# Patient Record
Sex: Female | Born: 1970 | Race: Asian | Hispanic: No | Marital: Single
Health system: Southern US, Community
[De-identification: ages and names within clinical notes are randomized; demographics above are authoritative.]

## PROBLEM LIST (undated history)

## (undated) ENCOUNTER — Inpatient Hospital Stay (HOSPITAL_COMMUNITY): Payer: Self-pay

## (undated) DIAGNOSIS — O24419 Gestational diabetes mellitus in pregnancy, unspecified control: Secondary | ICD-10-CM

## (undated) HISTORY — PX: WISDOM TOOTH EXTRACTION: SHX21

---

## 2008-04-21 ENCOUNTER — Ambulatory Visit: Payer: Self-pay | Admitting: Family Medicine

## 2008-04-22 ENCOUNTER — Telehealth (INDEPENDENT_AMBULATORY_CARE_PROVIDER_SITE_OTHER): Payer: Self-pay | Admitting: Nurse Practitioner

## 2010-03-17 ENCOUNTER — Inpatient Hospital Stay (HOSPITAL_COMMUNITY): Admission: AD | Admit: 2010-03-17 | Discharge: 2010-03-17 | Payer: Self-pay | Admitting: Obstetrics & Gynecology

## 2010-03-17 ENCOUNTER — Ambulatory Visit: Payer: Self-pay | Admitting: Family

## 2010-03-21 ENCOUNTER — Ambulatory Visit: Payer: Self-pay | Admitting: Nurse Practitioner

## 2010-03-21 ENCOUNTER — Inpatient Hospital Stay (HOSPITAL_COMMUNITY): Admission: AD | Admit: 2010-03-21 | Discharge: 2010-03-21 | Payer: Self-pay | Admitting: Obstetrics & Gynecology

## 2010-03-23 ENCOUNTER — Ambulatory Visit: Payer: Self-pay | Admitting: Obstetrics and Gynecology

## 2010-03-23 ENCOUNTER — Inpatient Hospital Stay (HOSPITAL_COMMUNITY): Admission: AD | Admit: 2010-03-23 | Discharge: 2010-03-23 | Payer: Self-pay | Admitting: Obstetrics & Gynecology

## 2010-03-30 ENCOUNTER — Ambulatory Visit: Payer: Self-pay | Admitting: Obstetrics and Gynecology

## 2010-03-31 ENCOUNTER — Encounter: Payer: Self-pay | Admitting: Obstetrics and Gynecology

## 2010-03-31 LAB — CONVERTED CEMR LAB: hCG, Beta Chain, Quant, S: 38.8 milliintl units/mL

## 2010-10-05 LAB — CBC
MCH: 32.6 pg (ref 26.0–34.0)
RDW: 12.9 % (ref 11.5–15.5)

## 2010-10-05 LAB — HCG, QUANTITATIVE, PREGNANCY: hCG, Beta Chain, Quant, S: 1435 m[IU]/mL — ABNORMAL HIGH (ref <5)

## 2010-10-06 LAB — URINE MICROSCOPIC-ADD ON

## 2010-10-06 LAB — WET PREP, GENITAL
Clue Cells Wet Prep HPF POC: NONE SEEN
Trich, Wet Prep: NONE SEEN

## 2010-10-06 LAB — URINALYSIS, ROUTINE W REFLEX MICROSCOPIC
Bilirubin Urine: NEGATIVE
Glucose, UA: NEGATIVE mg/dL
Ketones, ur: NEGATIVE mg/dL
Protein, ur: NEGATIVE mg/dL

## 2010-10-06 LAB — CBC
Hemoglobin: 13.2 g/dL (ref 12.0–15.0)
MCV: 94.3 fL (ref 78.0–100.0)
RBC: 4.13 MIL/uL (ref 3.87–5.11)
RDW: 13.1 % (ref 11.5–15.5)

## 2010-10-06 LAB — ABO/RH: ABO/RH(D): AB POS

## 2010-10-06 LAB — POCT PREGNANCY, URINE: Preg Test, Ur: POSITIVE

## 2011-11-13 ENCOUNTER — Inpatient Hospital Stay (HOSPITAL_COMMUNITY)
Admission: AD | Admit: 2011-11-13 | Discharge: 2011-11-13 | Disposition: A | Discharge status: Home / Self Care | Payer: Self-pay | Source: Ambulatory Visit | Attending: Obstetrics & Gynecology | Admitting: Obstetrics & Gynecology

## 2011-11-13 ENCOUNTER — Encounter (HOSPITAL_COMMUNITY): Payer: Self-pay

## 2011-11-13 DIAGNOSIS — R059 Cough, unspecified: Secondary | ICD-10-CM | POA: Insufficient documentation

## 2011-11-13 DIAGNOSIS — J069 Acute upper respiratory infection, unspecified: Secondary | ICD-10-CM | POA: Insufficient documentation

## 2011-11-13 DIAGNOSIS — R05 Cough: Secondary | ICD-10-CM | POA: Insufficient documentation

## 2011-11-13 DIAGNOSIS — O99891 Other specified diseases and conditions complicating pregnancy: Secondary | ICD-10-CM | POA: Insufficient documentation

## 2011-11-13 DIAGNOSIS — J3489 Other specified disorders of nose and nasal sinuses: Secondary | ICD-10-CM | POA: Insufficient documentation

## 2011-11-13 LAB — URINALYSIS, ROUTINE W REFLEX MICROSCOPIC
Bilirubin Urine: NEGATIVE
Glucose, UA: 100 mg/dL — AB
Ketones, ur: NEGATIVE mg/dL
Leukocytes, UA: NEGATIVE
Protein, ur: NEGATIVE mg/dL
Specific Gravity, Urine: <1.005 — ABNORMAL LOW (ref 1.005–1.030)
pH: 6.5 (ref 5.0–8.0)

## 2011-11-13 LAB — URINE MICROSCOPIC-ADD ON

## 2011-11-13 LAB — POCT PREGNANCY, URINE: Preg Test, Ur: POSITIVE — AB

## 2011-11-13 MED ORDER — DM-GUAIFENESIN ER 30-600 MG PO TB12: 1.0000 | ORAL_TABLET | Freq: Two times a day (BID) | ORAL | Status: AC

## 2011-11-13 MED ORDER — PRENATAL PLUS 27-1 MG PO TABS: 1.0000 | ORAL_TABLET | Freq: Every day | ORAL | Status: DC

## 2011-11-13 MED ORDER — DM-GUAIFENESIN ER 30-600 MG PO TB12
1.0000 | ORAL_TABLET | Freq: Once | ORAL | Status: AC
  Administered 2011-11-13: 1 via ORAL
  Filled 2011-11-13: qty 1

## 2011-11-13 NOTE — MAU Provider Note (Signed)
  History     CSN: [id number]  Arrival date and time: 11/13/11 1421   First Provider Initiated Contact with Patient 11/13/11 1450      Chief Complaint  Patient presents with  . Cough  . Nasal Congestion   Patient is a 41 y.o. female presenting with cough. The history is provided by the patient and the spouse.  Cough This is a new problem. The cough is non-productive. There has been no fever. Associated symptoms include rhinorrhea and eye redness. Pertinent negatives include no chest pain, no chills, no shortness of breath and no wheezing.  pt is [redacted]w[redacted]d pregnant and unsure of intended care provider- awaiting Medicaid- from [place].  Pt complains of dry hacking cough, mostly at night 2- 2:30am with occ white sputum.  She also has nasal congestion with some sore throat and noticed some enlargement of glands in neck, but have subsided.  She has some reddness on her right eye sclera.   Past Medical History  Diagnosis Date  . No pertinent past medical history     No past surgical history on file.  No family history on file.  History  Substance Use Topics  . Smoking status: Never Smoker   . Smokeless tobacco: Not on file  . Alcohol Use: No    Allergies: Allergies not on file  No prescriptions prior to admission    Review of Systems  Constitutional: Negative for chills.  HENT: Positive for rhinorrhea.   Eyes: Positive for redness.  Respiratory: Positive for cough. Negative for shortness of breath and wheezing.   Cardiovascular: Negative for chest pain.   Physical Exam   Blood pressure 99/56, pulse 76, temperature 97.6 F (36.4 C), temperature source Oral, resp. rate 16, height 5\' 1"  (1.549 m), weight 113 lb 6.4 oz (51.438 kg), last menstrual period 10/01/2011, SpO2 98.00%.  Physical Exam  Nursing note and vitals reviewed. Constitutional: She is oriented to person, place, and time. She appears well-developed and well-nourished.  HENT:  Head: Normocephalic.  Eyes: Pupils  are equal, round, and reactive to light. Right eye exhibits no discharge.  Neck: Normal range of motion. Neck supple. No thyromegaly present.  Cardiovascular: Normal rate and normal heart sounds.   Respiratory: Effort normal and breath sounds normal.  Lymphadenopathy:    She has no cervical adenopathy.  Neurological: She is alert and oriented to person, place, and time.  Skin: Skin is warm and dry.  Psychiatric: She has a normal mood and affect.    MAU Course  Procedures URI  Assessment and Plan  URI in pregnancy  Brandy Suarez 11/13/2011, 3:01 PM

## 2011-11-13 NOTE — MAU Note (Signed)
Cough and congestion x 1 week 

## 2011-11-13 NOTE — MAU Note (Signed)
Patient states that her stomach is sore from coughing denies pain.

## 2012-01-07 ENCOUNTER — Inpatient Hospital Stay (HOSPITAL_COMMUNITY)
Admission: AD | Admit: 2012-01-07 | Discharge: 2012-01-07 | Disposition: A | Discharge status: Home / Self Care | Payer: Medicaid Other | Source: Ambulatory Visit | Attending: Obstetrics & Gynecology | Admitting: Obstetrics & Gynecology

## 2012-01-07 ENCOUNTER — Encounter (HOSPITAL_COMMUNITY): Payer: Self-pay | Admitting: *Deleted

## 2012-01-07 DIAGNOSIS — O99891 Other specified diseases and conditions complicating pregnancy: Secondary | ICD-10-CM | POA: Insufficient documentation

## 2012-01-07 DIAGNOSIS — Z3490 Encounter for supervision of normal pregnancy, unspecified, unspecified trimester: Secondary | ICD-10-CM

## 2012-01-07 DIAGNOSIS — R0682 Tachypnea, not elsewhere classified: Secondary | ICD-10-CM | POA: Insufficient documentation

## 2012-01-07 DIAGNOSIS — R509 Fever, unspecified: Secondary | ICD-10-CM | POA: Insufficient documentation

## 2012-01-07 DIAGNOSIS — R0602 Shortness of breath: Secondary | ICD-10-CM | POA: Insufficient documentation

## 2012-01-07 DIAGNOSIS — R079 Chest pain, unspecified: Secondary | ICD-10-CM | POA: Insufficient documentation

## 2012-01-07 DIAGNOSIS — R069 Unspecified abnormalities of breathing: Secondary | ICD-10-CM

## 2012-01-07 LAB — URINE MICROSCOPIC-ADD ON

## 2012-01-07 LAB — URINALYSIS, ROUTINE W REFLEX MICROSCOPIC
Bilirubin Urine: NEGATIVE
Glucose, UA: NEGATIVE mg/dL
Ketones, ur: NEGATIVE mg/dL
Protein, ur: NEGATIVE mg/dL
Specific Gravity, Urine: 1.015 (ref 1.005–1.030)
Urobilinogen, UA: 0.2 mg/dL (ref 0.0–1.0)
pH: 7 (ref 5.0–8.0)

## 2012-01-07 LAB — CBC: WBC: 13.8 10*3/uL — ABNORMAL HIGH (ref 4.0–10.5)

## 2012-01-07 MED ORDER — PRENATAL VITAMINS (DIS) PO TABS: 1.0000 | ORAL_TABLET | Freq: Every day | ORAL | Status: DC

## 2012-01-07 NOTE — MAU Note (Signed)
Patient states she has had nausea for a few days, has had cold like symptoms for a couple of weeks with a sore throat and sometimes feels like she can't catch her breath. Denies any bleeding or pain.

## 2012-01-07 NOTE — Discharge Instructions (Signed)
Prenatal Care Providers °[facility]    [facility] ° Phone- [phone]     Phone: [phone] °         °[facility]                      [facility] ° @[place]     Phone: [phone] ° Phone: [phone] °        [facility] °[facility]     Phone: [phone] ° Phone: [phone]   °        [facility] °[facility] [place]                hone: [phone] ° Phone: [phone] °        [facility] °Dr. Bernard Marshall      Phone: [phone] ° Phone: [phone] °        [facility] °[facility]                Phone: [phone] ° [facility]  ° Phone:[phone]    [facility] °         Phone: [phone] °[facility] °  Phone: [phone] °

## 2012-01-07 NOTE — MAU Provider Note (Signed)
History     CSN: [id number]  Arrival date and time: 01/07/12 1414   None     Chief Complaint  Patient presents with  . Nausea  . Shortness of Breath   HPI  Subjective fever:  Feels as though her skin feels hot, but has not taking temperature.  Also has chills every day.  Is better this week.  Shortness of breath:  Has been going on for last month.  Feels short of breath whether she is resting or moving. Can go up and down a flight of stairs and walk over 2 blocks.  Denies chest pain.  Would like ultrasound to see how baby is doing:  She is concerned because she had a miscarriage after her last pregnancy and would like reassurance that this baby is doing okay.  Tired all the time:  Denies lightheadedness, loss of consciousness, dizziness.  OB History    Grav Para Term Preterm Abortions TAB SAB Ect Mult Living   3 1   1  1   1       Past Medical History  Diagnosis Date  . No pertinent past medical history     History reviewed. No pertinent past surgical history.  History reviewed. No pertinent family history.  History  Substance Use Topics  . Smoking status: Never Smoker   . Smokeless tobacco: Never Used  . Alcohol Use: No    Allergies: No Known Allergies  No prescriptions prior to admission    ROS  See HPI.  All other relevant ROS negative.  Physical Exam   Blood pressure 99/60, pulse 78, temperature 98.8 F (37.1 C), temperature source Oral, resp. rate 18, height 4\' 11"  (1.499 m), weight 51.347 kg (113 lb 3.2 oz), last menstrual period 10/01/2011, SpO2 100.00%.  Physical Exam  Constitutional: She is oriented to person, place, and time. She appears well-developed and well-nourished. No distress.  HENT:  Head: Normocephalic and atraumatic.  Eyes: Conjunctivae and EOM are normal.  Neck: No tracheal deviation present.  Cardiovascular: Normal rate, regular rhythm and normal heart sounds.   Respiratory: Effort normal and breath sounds normal. No stridor.    GI: Soft. She exhibits no distension and no mass. There is no tenderness. There is no rebound and no guarding.  Neurological: She is alert and oriented to person, place, and time.  Skin: She is not diaphoretic.  Psychiatric: She has a normal mood and affect. Her behavior is normal. Judgment and thought content normal.    MAU Course  Procedures  MDM   Assessment and Plan  Subjective fever: encouraged her to buy a thermometer and take her temperature.  If her temperature is greater than 100.4, she should call her physician or come back to the MAU.  Her mild leukocytosis is normal for pregnancy.  Shortness of breath without tachypnea, hypoxia, or chest pain.  Likely dyspnea secondary to physiologic changes in pregnancy.  Fatigue: Likely secondary to physiologic changes in pregnancy.  No anemia on CBC.  Results for orders placed during the hospital encounter of 01/07/12 (from the past 24 hour(s))  URINALYSIS, ROUTINE W REFLEX MICROSCOPIC     Status: Abnormal   Collection Time   01/07/12  3:00 PM      Component Value Range   Color, Urine YELLOW  YELLOW   APPearance CLEAR  CLEAR   Specific Gravity, Urine 1.015  1.005 - 1.030   pH 7.0  5.0 - 8.0   Glucose, UA NEGATIVE  NEGATIVE mg/dL   Hgb urine  dipstick TRACE (*) NEGATIVE   Bilirubin Urine NEGATIVE  NEGATIVE   Ketones, ur NEGATIVE  NEGATIVE mg/dL   Protein, ur NEGATIVE  NEGATIVE mg/dL   Urobilinogen, UA 0.2  0.0 - 1.0 mg/dL   Nitrite NEGATIVE  NEGATIVE   Leukocytes, UA NEGATIVE  NEGATIVE  URINE MICROSCOPIC-ADD ON     Status: Abnormal   Collection Time   01/07/12  3:00 PM      Component Value Range   Squamous Epithelial / LPF FEW (*) RARE   WBC, UA 0-2  <3 WBC/hpf   RBC / HPF 0-2  <3 RBC/hpf   Bacteria, UA FEW (*) RARE  CBC     Status: Abnormal   Collection Time   01/07/12  5:08 PM      Component Value Range   WBC 13.8 (*) 4.0 - 10.5 K/uL   RBC 3.89  3.87 - 5.11 MIL/uL   Hemoglobin 12.1  12.0 - 15.0 g/dL   HCT 16.1 (*) 09.6  - 46.0 %   MCV 91.0  78.0 - 100.0 fL   MCH 31.1  26.0 - 34.0 pg   MCHC 34.2  30.0 - 36.0 g/dL   RDW 04.5  40.9 - 81.1 %   Platelets 269  150 - 400 K/uL    Yaniv Lage 01/07/2012, 4:52 PM   I have been involved in the care of this patient. I discussed with the patient need for prenatal care and discussed reason for CBC today. I have reviewed this patient's vital signs, nurses notes and appropriate labs.

## 2012-01-31 ENCOUNTER — Inpatient Hospital Stay (HOSPITAL_COMMUNITY)
Admission: AD | Admit: 2012-01-31 | Discharge: 2012-01-31 | Disposition: A | Discharge status: Home / Self Care | Payer: Medicaid Other | Source: Ambulatory Visit | Attending: Obstetrics & Gynecology | Admitting: Obstetrics & Gynecology

## 2012-01-31 ENCOUNTER — Encounter (HOSPITAL_COMMUNITY): Payer: Self-pay | Admitting: *Deleted

## 2012-01-31 ENCOUNTER — Inpatient Hospital Stay (HOSPITAL_COMMUNITY): Payer: Medicaid Other

## 2012-01-31 DIAGNOSIS — O4690 Antepartum hemorrhage, unspecified, unspecified trimester: Secondary | ICD-10-CM

## 2012-01-31 DIAGNOSIS — O209 Hemorrhage in early pregnancy, unspecified: Secondary | ICD-10-CM | POA: Insufficient documentation

## 2012-01-31 LAB — WET PREP, GENITAL
Clue Cells Wet Prep HPF POC: NONE SEEN
Trich, Wet Prep: NONE SEEN
Yeast Wet Prep HPF POC: NONE SEEN

## 2012-01-31 NOTE — MAU Note (Signed)
Patient states she had a little bleeding last week, no pain.

## 2012-01-31 NOTE — MAU Note (Signed)
Pt heard fetal heart today. No pain or bleeding.  Timing of [place] explained.

## 2012-01-31 NOTE — MAU Note (Signed)
Here for [place] today. Requests and [place], last wk she was bleeding.

## 2012-02-01 LAB — GC/CHLAMYDIA PROBE AMP, GENITAL: GC Probe Amp, Genital: NEGATIVE

## 2012-03-12 ENCOUNTER — Encounter (HOSPITAL_COMMUNITY): Payer: Self-pay | Admitting: *Deleted

## 2012-03-12 ENCOUNTER — Inpatient Hospital Stay (HOSPITAL_COMMUNITY)
Admission: AD | Admit: 2012-03-12 | Discharge: 2012-03-12 | Disposition: A | Discharge status: Home / Self Care | Payer: Medicaid Other | Source: Ambulatory Visit | Attending: Obstetrics & Gynecology | Admitting: Obstetrics & Gynecology

## 2012-03-12 DIAGNOSIS — R42 Dizziness and giddiness: Secondary | ICD-10-CM | POA: Insufficient documentation

## 2012-03-12 DIAGNOSIS — O99891 Other specified diseases and conditions complicating pregnancy: Secondary | ICD-10-CM | POA: Insufficient documentation

## 2012-03-12 DIAGNOSIS — O26899 Other specified pregnancy related conditions, unspecified trimester: Secondary | ICD-10-CM

## 2012-03-12 DIAGNOSIS — O0930 Supervision of pregnancy with insufficient antenatal care, unspecified trimester: Secondary | ICD-10-CM | POA: Insufficient documentation

## 2012-03-12 DIAGNOSIS — R51 Headache: Secondary | ICD-10-CM

## 2012-03-12 LAB — CBC WITH DIFFERENTIAL/PLATELET
Eosinophils Relative: 2 % (ref 0–5)
HCT: 32 % — ABNORMAL LOW (ref 36.0–46.0)
Hemoglobin: 10.6 g/dL — ABNORMAL LOW (ref 12.0–15.0)
Lymphocytes Relative: 15 % (ref 12–46)
Lymphs Abs: 2 10*3/uL (ref 0.7–4.0)
MCV: 93.6 fL (ref 78.0–100.0)
Monocytes Absolute: 0.9 10*3/uL (ref 0.1–1.0)
Monocytes Relative: 7 % (ref 3–12)
Neutro Abs: 9.8 10*3/uL — ABNORMAL HIGH (ref 1.7–7.7)
RDW: 13.6 % (ref 11.5–15.5)
WBC: 12.8 10*3/uL — ABNORMAL HIGH (ref 4.0–10.5)

## 2012-03-12 LAB — URINALYSIS, ROUTINE W REFLEX MICROSCOPIC
Glucose, UA: NEGATIVE mg/dL
Hgb urine dipstick: NEGATIVE
Ketones, ur: NEGATIVE mg/dL
Protein, ur: NEGATIVE mg/dL
Urobilinogen, UA: 0.2 mg/dL (ref 0.0–1.0)

## 2012-03-12 LAB — URINE MICROSCOPIC-ADD ON

## 2012-03-12 MED ORDER — ACETAMINOPHEN 325 MG PO TABS
650.0000 mg | ORAL_TABLET | Freq: Once | ORAL | Status: AC
  Administered 2012-03-12: 650 mg via ORAL
  Filled 2012-03-12: qty 2

## 2012-03-12 NOTE — Progress Notes (Signed)
Pt complaining of dizziness and but also has a small headache

## 2012-03-12 NOTE — MAU Note (Signed)
Pt started having pain about 3 days ago, Pt has felt dizzy for about  1 week. Some nausea no vomiting

## 2012-03-12 NOTE — MAU Provider Note (Signed)
History     CSN: [id number]  Arrival date and time: 03/12/12 1451   First Provider Initiated Contact with Patient 03/12/12 1534      Chief Complaint  Patient presents with  . Pelvic Pain  . Dizziness   HPI  She is a 41 year old G3P1011 at [redacted]w[redacted]d with no prenatal care who presents today with new onset of dizziness and a headache that started a couple days ago.  No "room-spinning" sensation or presyncope.  The headache is mostly temporal, bilateral and intermittent.  Better when she rests.  Hasnt tried any meds for the headache.  No history of migraines.  Says she has been mildly nauseated recently and is worse with the headache.  No vomiting.  No fever or chills.  Fluid intake is fine.  Feels fetal movements.  No dysuria.  No vaginal bleeding or discharge and no leaking of fluids.  No contractions.  [place] and her husband translates appropriately for her.  Says they have sought prenatal care but haven't been able to get into a clinic yet.   Past Medical History  Diagnosis Date  . No pertinent past medical history     History reviewed. No pertinent past surgical history.  Family History  Problem Relation Age of Onset  . Other Neg Hx     History  Substance Use Topics  . Smoking status: Never Smoker   . Smokeless tobacco: Never Used  . Alcohol Use: No    Allergies: No Known Allergies  Prescriptions prior to admission  Medication Sig Dispense Refill  . Prenatal Vit-Fe Fumarate-FA (PRENATAL MULTIVITAMIN) TABS Take 1 tablet by mouth daily.        Review of Systems  Constitutional: Negative for fever and chills.  Eyes: Positive for blurred vision. Negative for double vision and photophobia.  Cardiovascular: Negative for chest pain and palpitations.  Gastrointestinal: Positive for nausea. Negative for vomiting, abdominal pain and diarrhea.  Genitourinary: Negative for dysuria and urgency.  Neurological: Positive for dizziness and headaches (bilateral, temporal).  Negative for speech change, focal weakness and loss of consciousness.   Physical Exam   Blood pressure 98/60, pulse 85, temperature 97.3 F (36.3 C), temperature source Oral, resp. rate 18, height 5' (1.524 m), weight 53.343 kg (117 lb 9.6 oz), last menstrual period 10/01/2011.  Physical Exam  Constitutional: She is oriented to person, place, and time. She appears well-developed and well-nourished. No distress.  HENT:  Head: Normocephalic.  Eyes: Pupils are equal, round, and reactive to light.  Cardiovascular: Normal rate, regular rhythm and normal heart sounds.   Respiratory: Effort normal and breath sounds normal.  GI: Soft. She exhibits no distension. There is no tenderness.  Neurological: She is alert and oriented to person, place, and time.  Skin: Skin is warm and dry.   Results for orders placed during the hospital encounter of 03/12/12 (from the past 24 hour(s))  URINALYSIS, ROUTINE W REFLEX MICROSCOPIC     Status: Abnormal   Collection Time   03/12/12  3:00 PM      Component Value Range   Color, Urine YELLOW  YELLOW   APPearance CLEAR  CLEAR   Specific Gravity, Urine 1.010  1.005 - 1.030   pH 7.5  5.0 - 8.0   Glucose, UA NEGATIVE  NEGATIVE mg/dL   Hgb urine dipstick NEGATIVE  NEGATIVE   Bilirubin Urine NEGATIVE  NEGATIVE   Ketones, ur NEGATIVE  NEGATIVE mg/dL   Protein, ur NEGATIVE  NEGATIVE mg/dL   Urobilinogen,  UA 0.2  0.0 - 1.0 mg/dL   Nitrite NEGATIVE  NEGATIVE   Leukocytes, UA SMALL (*) NEGATIVE  URINE MICROSCOPIC-ADD ON     Status: Abnormal   Collection Time   03/12/12  3:00 PM      Component Value Range   Squamous Epithelial / LPF FEW (*) RARE   WBC, UA 3-6  <3 WBC/hpf   RBC / HPF 0-2  <3 RBC/hpf   Bacteria, UA FEW (*) RARE  CBC WITH DIFFERENTIAL     Status: Abnormal   Collection Time   03/12/12  5:08 PM      Component Value Range   WBC 12.8 (*) 4.0 - 10.5 K/uL   RBC 3.42 (*) 3.87 - 5.11 MIL/uL   Hemoglobin 10.6 (*) 12.0 - 15.0 g/dL   HCT 16.1 (*) 09.6 -  46.0 %   MCV 93.6  78.0 - 100.0 fL   MCH 31.0  26.0 - 34.0 pg   MCHC 33.1  30.0 - 36.0 g/dL   RDW 04.5  40.9 - 81.1 %   Platelets 251  150 - 400 K/uL   Neutrophils Relative 76  43 - 77 %   Neutro Abs 9.8 (*) 1.7 - 7.7 K/uL   Lymphocytes Relative 15  12 - 46 %   Lymphs Abs 2.0  0.7 - 4.0 K/uL   Monocytes Relative 7  3 - 12 %   Monocytes Absolute 0.9  0.1 - 1.0 K/uL   Eosinophils Relative 2  0 - 5 %   Eosinophils Absolute 0.3  0.0 - 0.7 K/uL   Basophils Relative 0  0 - 1 %   Basophils Absolute 0.0  0.0 - 0.1 K/uL   MAU Course  Procedures Urinalysis CBC Fetal monitor tracing-reassuring Tylenol 650mg  PO for headache  17:15  I reviewed the history of present illness/chief complaint with the student and agree with his findings.  I will perform the pelvic exam.  Labs pending 17:40  Discussed with the patient and her husband the lab results and that she may take Tylenol prn for headache.  Her headache has greatly improved. Now 2/10.  They asked if we were planning an ultrasound to see the sex.  Explained that was not a medically necessary reason for Ultrasound.  They also need verification letter for Medicaid.  To begin prenatal care as soon as possible.  Phone number for [facility] given. Assessment and Plan    HOYLE, JASON 03/12/2012, 4:18 PM   A:  Headache at 103w2d gestation improved with Tylenol  P:  Verification letter given     Encouraged to begin prenatal care with doctor of their choice.

## 2012-04-02 ENCOUNTER — Other Ambulatory Visit (HOSPITAL_COMMUNITY)
Admission: RE | Admit: 2012-04-02 | Discharge: 2012-04-02 | Disposition: A | Discharge status: Home / Self Care | Payer: Medicaid Other | Source: Ambulatory Visit | Attending: Obstetrics and Gynecology | Admitting: Obstetrics and Gynecology

## 2012-04-02 ENCOUNTER — Ambulatory Visit (INDEPENDENT_AMBULATORY_CARE_PROVIDER_SITE_OTHER): Payer: Medicaid Other | Admitting: Obstetrics and Gynecology

## 2012-04-02 ENCOUNTER — Encounter: Payer: Self-pay | Admitting: Obstetrics and Gynecology

## 2012-04-02 VITALS — BP 97/56 | Wt 119.0 lb

## 2012-04-02 DIAGNOSIS — Z113 Encounter for screening for infections with a predominantly sexual mode of transmission: Secondary | ICD-10-CM | POA: Insufficient documentation

## 2012-04-02 DIAGNOSIS — O0930 Supervision of pregnancy with insufficient antenatal care, unspecified trimester: Secondary | ICD-10-CM | POA: Insufficient documentation

## 2012-04-02 DIAGNOSIS — O09522 Supervision of elderly multigravida, second trimester: Secondary | ICD-10-CM

## 2012-04-02 DIAGNOSIS — IMO0002 Reserved for concepts with insufficient information to code with codable children: Secondary | ICD-10-CM

## 2012-04-02 DIAGNOSIS — Z01419 Encounter for gynecological examination (general) (routine) without abnormal findings: Secondary | ICD-10-CM | POA: Insufficient documentation

## 2012-04-02 DIAGNOSIS — O09529 Supervision of elderly multigravida, unspecified trimester: Secondary | ICD-10-CM

## 2012-04-02 MED ORDER — PROMETHAZINE HCL 25 MG PO TABS: ORAL_TABLET | ORAL | Status: DC

## 2012-04-02 NOTE — Addendum Note (Signed)
Addended by: Catalina Antigua on: 04/02/2012 10:52 AM   Modules accepted: Orders

## 2012-04-02 NOTE — Patient Instructions (Signed)
Pregnancy - Second Trimester The second trimester of pregnancy (3 to 6 months) is a period of rapid growth for you and your baby. At the end of the sixth month, your baby is about 9 inches long and weighs 1 1/2 pounds. You will begin to feel the baby move between 18 and 20 weeks of the pregnancy. This is called quickening. Weight gain is faster. A clear fluid (colostrum) may leak out of your breasts. You may feel small contractions of the womb (uterus). This is known as false labor or Braxton-Hicks contractions. This is like a practice for labor when the baby is ready to be born. Usually, the problems with morning sickness have usually passed by the end of your first trimester. Some women develop small dark blotches (called cholasma, mask of pregnancy) on their face that usually goes away after the baby is born. Exposure to the sun makes the blotches worse. Acne may also develop in some pregnant women and pregnant women who have acne, may find that it goes away. PRENATAL EXAMS  Blood work may continue to be done during prenatal exams. These tests are done to check on your health and the probable health of your baby. Blood work is used to follow your blood levels (hemoglobin). Anemia (low hemoglobin) is common during pregnancy. Iron and vitamins are given to help prevent this. You will also be checked for diabetes between 24 and 28 weeks of the pregnancy. Some of the previous blood tests may be repeated.   The size of the uterus is measured during each visit. This is to make sure that the baby is continuing to grow properly according to the dates of the pregnancy.   Your blood pressure is checked every prenatal visit. This is to make sure you are not getting toxemia.   Your urine is checked to make sure you do not have an infection, diabetes or protein in the urine.   Your weight is checked often to make sure gains are happening at the suggested rate. This is to ensure that both you and your baby are  growing normally.   Sometimes, an ultrasound is performed to confirm the proper growth and development of the baby. This is a test which bounces harmless sound waves off the baby so your caregiver can more accurately determine due dates.  Sometimes, a specialized test is done on the amniotic fluid surrounding the baby. This test is called an amniocentesis. The amniotic fluid is obtained by sticking a needle into the belly (abdomen). This is done to check the chromosomes in instances where there is a concern about possible genetic problems with the baby. It is also sometimes done near the end of pregnancy if an early delivery is required. In this case, it is done to help make sure the baby's lungs are mature enough for the baby to live outside of the womb. CHANGES OCCURING IN THE SECOND TRIMESTER OF PREGNANCY Your body goes through many changes during pregnancy. They vary from person to person. Talk to your caregiver about changes you notice that you are concerned about.  During the second trimester, you will likely have an increase in your appetite. It is normal to have cravings for certain foods. This varies from person to person and pregnancy to pregnancy.   Your lower abdomen will begin to bulge.   You may have to urinate more often because the uterus and baby are pressing on your bladder. It is also common to get more bladder infections during pregnancy (  pain with urination). You can help this by drinking lots of fluids and emptying your bladder before and after intercourse.   You may begin to get stretch marks on your hips, abdomen, and breasts. These are normal changes in the body during pregnancy. There are no exercises or medications to take that prevent this change.   You may begin to develop swollen and bulging veins (varicose veins) in your legs. Wearing support hose, elevating your feet for 15 minutes, 3 to 4 times a day and limiting salt in your diet helps lessen the problem.    Heartburn may develop as the uterus grows and pushes up against the stomach. Antacids recommended by your caregiver helps with this problem. Also, eating smaller meals 4 to 5 times a day helps.   Constipation can be treated with a stool softener or adding bulk to your diet. Drinking lots of fluids, vegetables, fruits, and whole grains are helpful.   Exercising is also helpful. If you have been very active up until your pregnancy, most of these activities can be continued during your pregnancy. If you have been less active, it is helpful to start an exercise program such as walking.   Hemorrhoids (varicose veins in the rectum) may develop at the end of the second trimester. Warm sitz baths and hemorrhoid cream recommended by your caregiver helps hemorrhoid problems.   Backaches may develop during this time of your pregnancy. Avoid heavy lifting, wear low heal shoes and practice good posture to help with backache problems.   Some pregnant women develop tingling and numbness of their hand and fingers because of swelling and tightening of ligaments in the wrist (carpel tunnel syndrome). This goes away after the baby is born.   As your breasts enlarge, you may have to get a bigger bra. Get a comfortable, cotton, support bra. Do not get a nursing bra until the last month of the pregnancy if you will be nursing the baby.   You may get a dark line from your belly button to the pubic area called the linea nigra.   You may develop rosy cheeks because of increase blood flow to the face.   You may develop spider looking lines of the face, neck, arms and chest. These go away after the baby is born.  HOME CARE INSTRUCTIONS   It is extremely important to avoid all smoking, herbs, alcohol, and unprescribed drugs during your pregnancy. These chemicals affect the formation and growth of the baby. Avoid these chemicals throughout the pregnancy to ensure the delivery of a healthy infant.   Most of your home  care instructions are the same as suggested for the first trimester of your pregnancy. Keep your caregiver's appointments. Follow your caregiver's instructions regarding medication use, exercise and diet.   During pregnancy, you are providing food for you and your baby. Continue to eat regular, well-balanced meals. Choose foods such as meat, fish, milk and other low fat dairy products, vegetables, fruits, and whole-grain breads and cereals. Your caregiver will tell you of the ideal weight gain.   A physical sexual relationship may be continued up until near the end of pregnancy if there are no other problems. Problems could include early (premature) leaking of amniotic fluid from the membranes, vaginal bleeding, abdominal pain, or other medical or pregnancy problems.   Exercise regularly if there are no restrictions. Check with your caregiver if you are unsure of the safety of some of your exercises. The greatest weight gain will occur in the   last 2 trimesters of pregnancy. Exercise will help you:   Control your weight.   Get you in shape for labor and delivery.   Lose weight after you have the baby.   Wear a good support or jogging bra for breast tenderness during pregnancy. This may help if worn during sleep. Pads or tissues may be used in the bra if you are leaking colostrum.   Do not use hot tubs, steam rooms or saunas throughout the pregnancy.   Wear your seat belt at all times when driving. This protects you and your baby if you are in an accident.   Avoid raw meat, uncooked cheese, cat litter boxes and soil used by cats. These carry germs that can cause birth defects in the baby.   The second trimester is also a good time to visit your dentist for your dental health if this has not been done yet. Getting your teeth cleaned is OK. Use a soft toothbrush. Brush gently during pregnancy.   It is easier to loose urine during pregnancy. Tightening up and strengthening the pelvic muscles will  help with this problem. Practice stopping your urination while you are going to the bathroom. These are the same muscles you need to strengthen. It is also the muscles you would use as if you were trying to stop from passing gas. You can practice tightening these muscles up 10 times a set and repeating this about 3 times per day. Once you know what muscles to tighten up, do not perform these exercises during urination. It is more likely to contribute to an infection by backing up the urine.   Ask for help if you have financial, counseling or nutritional needs during pregnancy. Your caregiver will be able to offer counseling for these needs as well as refer you for other special needs.   Your skin may become oily. If so, wash your face with mild soap, use non-greasy moisturizer and oil or cream based makeup.  MEDICATIONS AND DRUG USE IN PREGNANCY  Take prenatal vitamins as directed. The vitamin should contain 1 milligram of folic acid. Keep all vitamins out of reach of children. Only a couple vitamins or tablets containing iron may be fatal to a baby or young child when ingested.   Avoid use of all medications, including herbs, over-the-counter medications, not prescribed or suggested by your caregiver. Only take over-the-counter or prescription medicines for pain, discomfort, or fever as directed by your caregiver. Do not use aspirin.   Let your caregiver also know about herbs you may be using.   Alcohol is related to a number of birth defects. This includes fetal alcohol syndrome. All alcohol, in any form, should be avoided completely. Smoking will cause low birth rate and premature babies.   Street or illegal drugs are very harmful to the baby. They are absolutely forbidden. A baby born to an addicted mother will be addicted at birth. The baby will go through the same withdrawal an adult does.  SEEK MEDICAL CARE IF:  You have any concerns or worries during your pregnancy. It is better to call with  your questions if you feel they cannot wait, rather than worry about them. SEEK IMMEDIATE MEDICAL CARE IF:   An unexplained oral temperature above 100.4 F (38 C) develops, or as your caregiver suggests.   You have leaking of fluid from the vagina (birth canal). If leaking membranes are suspected, take your temperature and tell your caregiver of this when you call.   There   is vaginal spotting, bleeding, or passing clots. Tell your caregiver of the amount and how many pads are used. Light spotting in pregnancy is common, especially following intercourse.   You develop a bad smelling vaginal discharge with a change in the color from clear to white.   You continue to feel sick to your stomach (nauseated) and have no relief from remedies suggested. You vomit blood or coffee ground-like materials.   You lose more than 2 pounds of weight or gain more than 2 pounds of weight over 1 week, or as suggested by your caregiver.   You notice swelling of your face, hands, feet, or legs.   You get exposed to German measles and have never had them.   You are exposed to fifth disease or chickenpox.   You develop belly (abdominal) pain. Round ligament discomfort is a common non-cancerous (benign) cause of abdominal pain in pregnancy. Your caregiver still must evaluate you.   You develop a bad headache that does not go away.   You develop fever, diarrhea, pain with urination, or shortness of breath.   You develop visual problems, blurry, or double vision.   You fall or are in a car accident or any kind of trauma.   There is mental or physical violence at home.  Document Released: 07/03/2001 Document Revised: 06/28/2011 Document Reviewed: 01/05/2009 [employer] Patient Information 2012 [employer]. 

## 2012-04-02 NOTE — Progress Notes (Signed)
   Subjective:    Brandy Suarez is a [id number] [redacted]w[redacted]d being seen today for her first obstetrical visit.  Her obstetrical history is significant for advanced maternal age. Patient does intend to breast feed. Pregnancy history fully reviewed.  Patient reports nausea. Patient otherwise doing well without complaints.  Filed Vitals:   04/02/12 1000  BP: 97/56  Weight: 119 lb (53.978 kg)    HISTORY: OB History    Grav Para Term Preterm Abortions TAB SAB Ect Mult Living   3 1 1  1  1   1      # Outc Date GA Lbr Len/2nd Wgt Sex Del Anes PTL Lv   1 TRM 12/00    M SVD  No Yes   2 SAB            3 CUR              Past Medical History  Diagnosis Date  . No pertinent past medical history    Past Surgical History  Procedure Date  . Wisdom tooth extraction    Family History  Problem Relation Age of Onset  . Other Neg Hx      Exam    Uterus:     Pelvic Exam:    Perineum: Normal Perineum   Vulva: normal   Vagina:  normal mucosa, normal discharge   pH:    Cervix: closed, long, posterior   Adnexa: not evaluated   Bony Pelvis: android  System: Breast:  normal appearance, no masses or tenderness   Skin: normal coloration and turgor, no rashes    Neurologic: oriented, grossly non-focal   Extremities: normal strength, tone, and muscle mass, no erythema, induration, or nodules   HEENT extra ocular movement intact   Mouth/Teeth mucous membranes moist, pharynx normal without lesions   Neck supple and no masses   Cardiovascular: regular rate and rhythm   Respiratory:  chest clear, no wheezing, crepitations, rhonchi, normal symmetric air entry   Abdomen: soft, gravid, NT   Urinary:       Assessment:    Pregnancy: G3P1011 Patient Active Problem List  Diagnosis  . INFLUENZA  . Supervision of elderly multigravida in second trimester  . Advanced maternal age in pregnancy  . Late prenatal care complicating pregnancy        Plan:     Initial labs drawn. Prenatal  vitamins. Problem list reviewed and updated. Patient referred to genetic counseling for advanced maternal age Genetic Screening discussed [place]: requested.  Ultrasound discussed; fetal survey: ordered.  Follow up in 3 weeks. Patient will return prior to next appointment for 1 hr GCT 30 min visit spent on counseling and coordination of care.     Maricarmen Braziel 04/02/2012

## 2012-04-03 LAB — OBSTETRIC PANEL
Antibody Screen: NEGATIVE
Basophils Absolute: 0 10*3/uL (ref 0.0–0.1)
HCT: 33.6 % — ABNORMAL LOW (ref 36.0–46.0)
Hemoglobin: 11.1 g/dL — ABNORMAL LOW (ref 12.0–15.0)
Lymphocytes Relative: 16 % (ref 12–46)
Lymphs Abs: 1.6 10*3/uL (ref 0.7–4.0)
MCV: 93.6 fL (ref 78.0–100.0)
Monocytes Absolute: 0.3 10*3/uL (ref 0.1–1.0)
Neutro Abs: 8 10*3/uL — ABNORMAL HIGH (ref 1.7–7.7)
RBC: 3.59 MIL/uL — ABNORMAL LOW (ref 3.87–5.11)
RDW: 14.1 % (ref 11.5–15.5)
Rubella: 8 IU/mL — ABNORMAL HIGH
WBC: 10 10*3/uL (ref 4.0–10.5)

## 2012-04-04 ENCOUNTER — Ambulatory Visit (HOSPITAL_COMMUNITY)
Admission: RE | Admit: 2012-04-04 | Discharge: 2012-04-04 | Disposition: A | Discharge status: Home / Self Care | Payer: Medicaid Other | Source: Ambulatory Visit | Attending: Obstetrics and Gynecology | Admitting: Obstetrics and Gynecology

## 2012-04-04 DIAGNOSIS — Z1389 Encounter for screening for other disorder: Secondary | ICD-10-CM | POA: Insufficient documentation

## 2012-04-04 DIAGNOSIS — O358XX0 Maternal care for other (suspected) fetal abnormality and damage, not applicable or unspecified: Secondary | ICD-10-CM | POA: Insufficient documentation

## 2012-04-04 DIAGNOSIS — Z363 Encounter for antenatal screening for malformations: Secondary | ICD-10-CM | POA: Insufficient documentation

## 2012-04-04 DIAGNOSIS — O09522 Supervision of elderly multigravida, second trimester: Secondary | ICD-10-CM

## 2012-04-04 DIAGNOSIS — O09529 Supervision of elderly multigravida, unspecified trimester: Secondary | ICD-10-CM | POA: Insufficient documentation

## 2012-04-07 ENCOUNTER — Encounter: Payer: Self-pay | Admitting: Obstetrics and Gynecology

## 2012-04-09 ENCOUNTER — Other Ambulatory Visit (INDEPENDENT_AMBULATORY_CARE_PROVIDER_SITE_OTHER): Payer: Medicaid Other | Admitting: *Deleted

## 2012-04-09 DIAGNOSIS — IMO0002 Reserved for concepts with insufficient information to code with codable children: Secondary | ICD-10-CM

## 2012-04-09 DIAGNOSIS — Z3480 Encounter for supervision of other normal pregnancy, unspecified trimester: Secondary | ICD-10-CM

## 2012-04-09 DIAGNOSIS — O24419 Gestational diabetes mellitus in pregnancy, unspecified control: Secondary | ICD-10-CM

## 2012-04-09 DIAGNOSIS — O09522 Supervision of elderly multigravida, second trimester: Secondary | ICD-10-CM

## 2012-04-09 MED ORDER — ESTRADIOL 0.025 MG/24HR TD PTTW: 1.0000 | MEDICATED_PATCH | TRANSDERMAL | Status: DC

## 2012-04-09 NOTE — Progress Notes (Addendum)
Patient is here for one hour glucose testing only.

## 2012-04-09 NOTE — Addendum Note (Signed)
Addended by: Barbara Cower on: 04/09/2012 03:25 PM   Modules accepted: Orders

## 2012-04-10 ENCOUNTER — Encounter: Payer: Self-pay | Admitting: Family Medicine

## 2012-04-10 DIAGNOSIS — O24419 Gestational diabetes mellitus in pregnancy, unspecified control: Secondary | ICD-10-CM | POA: Insufficient documentation

## 2012-04-10 LAB — CBC WITH DIFFERENTIAL/PLATELET
Eosinophils Absolute: 0.2 10*3/uL (ref 0.0–0.7)
Eosinophils Relative: 2 % (ref 0–5)
Hemoglobin: 11.6 g/dL — ABNORMAL LOW (ref 12.0–15.0)
Lymphs Abs: 1.8 10*3/uL (ref 0.7–4.0)
MCH: 30.9 pg (ref 26.0–34.0)
MCHC: 33 g/dL (ref 30.0–36.0)
MCV: 93.6 fL (ref 78.0–100.0)
Monocytes Relative: 5 % (ref 3–12)
RBC: 3.76 MIL/uL — ABNORMAL LOW (ref 3.87–5.11)

## 2012-04-10 NOTE — Addendum Note (Signed)
Addended by: Barbara Cower on: 04/10/2012 12:00 PM   Modules accepted: Orders

## 2012-04-11 LAB — HARMONY PRENATAL TEST: PDF Image: 0

## 2012-04-14 ENCOUNTER — Encounter: Payer: Self-pay | Admitting: Obstetrics and Gynecology

## 2012-04-23 ENCOUNTER — Ambulatory Visit (INDEPENDENT_AMBULATORY_CARE_PROVIDER_SITE_OTHER): Payer: Medicaid Other | Admitting: Obstetrics & Gynecology

## 2012-04-23 VITALS — BP 96/55 | Wt 121.0 lb

## 2012-04-23 DIAGNOSIS — O09522 Supervision of elderly multigravida, second trimester: Secondary | ICD-10-CM

## 2012-04-23 DIAGNOSIS — O09529 Supervision of elderly multigravida, unspecified trimester: Secondary | ICD-10-CM

## 2012-04-23 DIAGNOSIS — J1189 Influenza due to unidentified influenza virus with other manifestations: Secondary | ICD-10-CM

## 2012-04-23 DIAGNOSIS — Z609 Problem related to social environment, unspecified: Secondary | ICD-10-CM

## 2012-04-23 DIAGNOSIS — O24419 Gestational diabetes mellitus in pregnancy, unspecified control: Secondary | ICD-10-CM

## 2012-04-23 DIAGNOSIS — O99810 Abnormal glucose complicating pregnancy: Secondary | ICD-10-CM

## 2012-04-23 DIAGNOSIS — Z789 Other specified health status: Secondary | ICD-10-CM | POA: Insufficient documentation

## 2012-04-23 DIAGNOSIS — IMO0002 Reserved for concepts with insufficient information to code with codable children: Secondary | ICD-10-CM

## 2012-04-23 DIAGNOSIS — Z758 Other problems related to medical facilities and other health care: Secondary | ICD-10-CM | POA: Insufficient documentation

## 2012-04-23 NOTE — Patient Instructions (Signed)
Gestational Diabetes Mellitus Gestational diabetes mellitus (GDM) is diabetes that occurs only during pregnancy. This happens when the body cannot properly handle the glucose (sugar) that increases in the blood after eating. During pregnancy, insulin resistance (reduced sensitivity to insulin) occurs because of the release of hormones from the placenta. Usually, the pancreas of pregnant women produces enough insulin to overcome the resistance that occurs. However, in gestational diabetes, the insulin is there but it does not work effectively. If the resistance is severe enough that the pancreas does not produce enough insulin, extra glucose builds up in the blood.  WHO IS AT RISK FOR DEVELOPING GESTATIONAL DIABETES?  Women with a history of diabetes in the family.  Women over age 14.  Women who are overweight.  Women in certain ethnic groups (Hispanic, African American, Native American, [place] and [place]er). WHAT CAN HAPPEN TO THE BABY? If the mother's blood glucose is too high while she is pregnant, the extra sugar will travel through the umbilical cord to the baby. Some of the problems the baby may have are:  Large Baby - If the baby receives too much sugar, the baby will gain more weight. This may cause the baby to be too large to be born normally (vaginally) and a Cesarean section (C-section) may be needed.  Low Blood Glucose (hypoglycemia)  The baby makes extra insulin, in response to the extra sugar its gets from its mother. When the baby is born and no longer needs this extra insulin, the baby's blood glucose level may drop.  Jaundice (yellow coloring of the skin and eyes)  This is fairly common in babies. It is caused from a build-up of the chemical called bilirubin. This is rarely serious, but is seen more often in babies whose mothers had gestational diabetes. RISKS TO THE MOTHER Women who have had gestational diabetes may be at higher risk for some problems,  including:  Preeclampsia or toxemia, which includes problems with high blood pressure. Blood pressure and protein levels in the urine must be checked frequently.  Infections.  Cesarean section (C-section) for delivery.  Developing Type 2 diabetes later in life. About 30-50% will develop diabetes later, especially if obese. DIAGNOSIS  The hormones that cause insulin resistance are highest at about 24-28 weeks of pregnancy. If symptoms are experienced, they are much like symptoms you would normally expect during pregnancy.  GDM is often diagnosed using a two part method: 1. After 24-28 weeks of pregnancy, the woman drinks a glucose solution and takes a blood test. If the glucose level is high, a second test will be given. If it is too high (190 or above), no second test is needed and the patient is diagnosed with GDM. 2. Oral Glucose Tolerance Test (OGTT) which is 3 hours long  After not eating overnight, the blood glucose is checked. The woman drinks a glucose solution, and hourly blood glucose tests are taken. If the woman has risk factors for GDM, the caregiver may test earlier than 24 weeks of pregnancy. TREATMENT  Treatment of GDM is directed at keeping the mother's blood glucose level normal, and may include:  Meal planning.  Taking insulin or other medicine to control your blood glucose level.  Exercise.  Keeping a daily record of the foods you eat.  Blood glucose monitoring and keeping a record of your blood glucose levels.  May monitor ketone levels in the urine, although this is no longer considered necessary in most pregnancies. HOME CARE INSTRUCTIONS  While you are pregnant:  Follow your caregiver's advice regarding your prenatal appointments, meal planning, exercise, medicines, vitamins, blood and other tests, and physical activities.  Keep a record of your meals, blood glucose tests, and the amount of insulin you are taking (if any). Show this to your caregiver at every  prenatal visit.  If you have GDM, you may have problems with hypoglycemia (low blood glucose). You may suspect this if you become suddenly dizzy, feel shaky, and/or weak. If you think this is happening and you have a glucose meter, try to test your blood glucose level. Follow your caregiver's advice for when and how to treat your low blood glucose. Generally, the 15:15 rule is followed: Treat by consuming 15 grams of carbohydrates, wait 15 minutes, and recheck blood glucose. Examples of 15 grams of carbohydrates are:  1 cup skim or low-fat milk.   cup juice.  3-4 glucose tablets.  5-6 hard candies.  1 small box raisins.   cup regular soda pop.  Practice good hygiene, to avoid infections.  Do not smoke. SEEK MEDICAL CARE IF:   You develop abnormal vaginal discharge, with or without itching.  You become weak and tired more than expected.  You seem to sweat a lot.  You have a sudden increase in weight, 5 pounds or more in one week.  You are losing weight, 3 pounds or more in a week.  Your blood glucose level is high, and you need instructions on what to do about it. SEEK IMMEDIATE MEDICAL CARE IF:   You develop a severe headache.  You faint or pass out.  You develop nausea and vomiting.  You become disoriented or confused.  You have a convulsion.  You develop vision problems.  You develop stomach pain.  You develop vaginal bleeding.  You develop uterine contractions.  You have leaking or a gush of fluid from the vagina. AFTER YOU HAVE THE BABY:  Go to all of your follow-up appointments, and have blood tests as advised by your caregiver.  Maintain a healthy lifestyle, to prevent diabetes in the future. This includes:  Following a healthy meal plan.  Controlling your weight.  Getting enough exercise and proper rest.  Do not smoke.  Breastfeed your baby if you can. This will lower the chance of you and your baby developing diabetes later in life. For  more information about diabetes, go to the American Diabetes Association at: PMFashions.com.cy. For more information about gestational diabetes, go to the [employer] at: RentRule.com.au. Document Released: 10/15/2000 Document Revised: 10/01/2011 Document Reviewed: 05/09/2009 [facility] Patient Information 2013 [place], [place].

## 2012-04-23 NOTE — Progress Notes (Signed)
New diagnosis of GDM. Using the [place] interpreter, explained the diagnosis of GDM and its maternal-fetal implications.  Patient will meet with DM educator and Nutritionist tomorrow and get supplies for CBG testing.  Counseled about GDM diet.  No other complaints or concerns.  Fetal movement and labor precautions reviewed. Return in one week.

## 2012-04-24 ENCOUNTER — Ambulatory Visit (HOSPITAL_COMMUNITY)
Admission: RE | Admit: 2012-04-24 | Discharge: 2012-04-24 | Disposition: A | Discharge status: Home / Self Care | Payer: Medicaid Other | Source: Ambulatory Visit | Attending: Obstetrics & Gynecology | Admitting: Obstetrics & Gynecology

## 2012-04-24 ENCOUNTER — Encounter: Payer: Medicaid Other | Attending: Obstetrics & Gynecology | Admitting: Dietician

## 2012-04-24 DIAGNOSIS — Z713 Dietary counseling and surveillance: Secondary | ICD-10-CM | POA: Insufficient documentation

## 2012-04-24 DIAGNOSIS — O99810 Abnormal glucose complicating pregnancy: Secondary | ICD-10-CM | POA: Insufficient documentation

## 2012-04-24 NOTE — ED Notes (Addendum)
Diabetes Education:  A G3 P1 lady comes for GDM education.  Accompanied by her husband and the [place] interpreter. HT: 49 in.  WT: 121 lb.  EDD: 07/07/2012. Completed a review of the diet and meal schedule for the GDM.  She currently has a schedule of eating 2 meals per day and sleeps until late in the morning.  Tried to get some more small snacks, meals into her routine.  We will need to see how this influences her blood glucose levels.  From assessment, she tends to follow a [place] diet.  Diet is high in noodles, rice, non-starchy vegetables and a little meat/protein at meals.  Provided an Accu-Chek Smartview meter kit.  She  Is to contact her MD for a prescription for the Accu-Chek Smart View Strips and the Accu-Chek FastClix lancets.  Today on return demonstration of BGM, her glucose was 96 at 4:30 PM. Instructed to monitor fasting and 2 hr after the first bite of each meal.  Instructed to bring meter and glucose log to all clinic appointments.  Maggie Charlese Gruetzmacher, RN, CDE   Provided educational materials in [place] "Nutrition, Diabetes, and Pregnancy".

## 2012-04-30 ENCOUNTER — Ambulatory Visit (INDEPENDENT_AMBULATORY_CARE_PROVIDER_SITE_OTHER): Payer: Medicaid Other | Admitting: Obstetrics & Gynecology

## 2012-04-30 VITALS — BP 89/64 | Wt 120.0 lb

## 2012-04-30 DIAGNOSIS — IMO0002 Reserved for concepts with insufficient information to code with codable children: Secondary | ICD-10-CM

## 2012-04-30 DIAGNOSIS — O24419 Gestational diabetes mellitus in pregnancy, unspecified control: Secondary | ICD-10-CM

## 2012-04-30 DIAGNOSIS — Z23 Encounter for immunization: Secondary | ICD-10-CM

## 2012-04-30 DIAGNOSIS — O09522 Supervision of elderly multigravida, second trimester: Secondary | ICD-10-CM

## 2012-04-30 DIAGNOSIS — Z789 Other specified health status: Secondary | ICD-10-CM

## 2012-04-30 DIAGNOSIS — O0930 Supervision of pregnancy with insufficient antenatal care, unspecified trimester: Secondary | ICD-10-CM

## 2012-04-30 DIAGNOSIS — Z609 Problem related to social environment, unspecified: Secondary | ICD-10-CM

## 2012-04-30 DIAGNOSIS — O99810 Abnormal glucose complicating pregnancy: Secondary | ICD-10-CM

## 2012-04-30 DIAGNOSIS — O09529 Supervision of elderly multigravida, unspecified trimester: Secondary | ICD-10-CM

## 2012-04-30 MED ORDER — ACCU-CHEK FASTCLIX LANCETS MISC: 1.0000 [IU] | Freq: Four times a day (QID) | Status: DC

## 2012-04-30 MED ORDER — GLUCOSE BLOOD VI STRP: ORAL_STRIP | Status: DC

## 2012-04-30 NOTE — Patient Instructions (Signed)
Return to clinic for any obstetric concerns or go to [facility] for evaluation  

## 2012-04-30 NOTE — Progress Notes (Signed)
Had her GDm education with Seward Grater, offered transferring to [facility] DM clinic since she lives in [place] but she wants to continue here. No CBGs recorded, prescribed lancets and strips.  Return in one week. Flu shot today.  No other complaints or concerns.  Fetal movement and labor precautions reviewed.  [place] interpreter present.

## 2012-05-19 ENCOUNTER — Ambulatory Visit (INDEPENDENT_AMBULATORY_CARE_PROVIDER_SITE_OTHER): Payer: Medicaid Other | Admitting: Family Medicine

## 2012-05-19 VITALS — BP 96/62 | Wt 123.0 lb

## 2012-05-19 DIAGNOSIS — Z3490 Encounter for supervision of normal pregnancy, unspecified, unspecified trimester: Secondary | ICD-10-CM

## 2012-05-19 DIAGNOSIS — Z3480 Encounter for supervision of other normal pregnancy, unspecified trimester: Secondary | ICD-10-CM

## 2012-05-19 DIAGNOSIS — O99810 Abnormal glucose complicating pregnancy: Secondary | ICD-10-CM

## 2012-05-19 DIAGNOSIS — O9989 Other specified diseases and conditions complicating pregnancy, childbirth and the puerperium: Secondary | ICD-10-CM

## 2012-05-19 DIAGNOSIS — Z283 Underimmunization status: Secondary | ICD-10-CM | POA: Insufficient documentation

## 2012-05-19 DIAGNOSIS — O24419 Gestational diabetes mellitus in pregnancy, unspecified control: Secondary | ICD-10-CM

## 2012-05-19 NOTE — Patient Instructions (Addendum)
Cho Con B (Breastfeeding) L?I CH C?A VI?C CHO CON B ??i v?i em b  S?a ??u (s?a non) gip h? tiu ha c?a b ho?t ??ng t?t h?n.   C nh?ng khng th? t? m? trong s?a gip b ch?ng l?i s? ly nhi?m.   B t b? ?nh h??ng b?i b?nh hen suy?n, d? ?ng v SIDS (sudden infant death syndrome, h?i ch?ng ??t t? ? tr? em).   Cc ch?t dinh d??ng trong s?a m? t?t h?n s?a cng th?c cho b v gip no b pht tri?n t?t h?n.   Nh?ng em b ???c b s?a m? c t kh, t ?au b?ng v t b? to bn h?n.  ??i v?i m?  Cho con b gip pht tri?n m?i lin k?t ??c bi?t gi?a m? v b.   Cho con b ti?n l?i h?n, s?a lun c s?n ? nhi?t ?? ph h?p v r? h?n cho ?n s?a cng th?c.   Cho con b s? ??t calo trong m? v gip gi?m cn ? t?ng trong qu trnh mang thai.   Cho con b s? lm cho c? t? cung co v? kch c? bnh th??ng nhanh h?n v lm ch?m s? ch?y mu sau khi sinh.   Nh?ng b m? cho con b t c nguy c? pht tri?n ung th? v h?n.  CHO B TH??NG XUYN  M?t em b kh?e m?nh, ?? thng c th? ???c cho b m? t? m?i gi? m?t l?n ??n ba gi? m?t l?n.   T?n su?t cho b khc nhau, ty thu?c vo t?ng b. Theo di b xem c d?u hi?u b? ?i hay khng, ch? khng ph?i theo di ??ng h?Theodoro Grist b th??ng xuyn khi em b ?i, ho?c khi b?n c?n gi?m ?? c?ng c?a b?u s?a.   ?nh th?c b n?u ? qu 3-4 gi? k? t? l?n cho b tr??c.   Cho b th??ng xuyn s? gip m? t?o nhi?u s?a h?n v s? trnh v?n ?? nh? l ?au nm v v ? mu ? ng?c.  V? TR C?A B SO V?I NG?C  Cho d n?m hay ng?i, hy ??m b?o b?ng c?a b quay v? pha b?ng b?n.   ?? ng?c b?ng b?n ngn tay ??t d??i ng?c v ngn ci ? pha trn. ??m b?o cc ngn tay c?a b?n ??t ? xa nm v v mi?ng b.   Nh? nhng vu?t mi v bn m c?a b st v?i ng?c nh?t b?ng ngn tay ho?c nm v.   Khi mi?ng b m? ?? r?ng, hy ??t ton b? nm v v cng nhi?u vng s?m mu quanh nm v vo mi?ng b cng t?t.   Ko b st vo ?? ??u m?i v m b ch?m vo ng?c trong khi b.  CHO  B  Th?i gian c?a m?i l?n b ty thu?c vo t?ng b v t?ng l?n b.   B ph?i mt kho?ng hai ??n ba pht ?? s?a ch?y vo mi?ng b. ?y ???c g?i l qu trnh "x?". V l do ny, hy ?? b b trn m?i ng?c cho t?i khi b khng cn mu?n b. B s? ng?ng b khi ? nh?n ?? dinh d??ng.   ?? ng?ng mt, hy ??t ngn tay c?a b?n vo gc mi?ng b v tr??t ngn tay gi?a hai l?i c?a b tr??c khi rt ng?c ra kh?i mi?ng b. ?i?u ny s? gip trnh ?au nm v.  GI?M ? MU ? NG?C  Trong tu?n ??u  tin sau khi sinh, b?n c th? g?p ph?i d?u hi?u ? mu ? ng?c. Khi ng?c b? ? mu, b?n s? c?m th?y ng?c n?ng, ?m, c?ng v c th? nh?y c?m ?au khi s? vo. B?n c th? gi?m ? mu n?u:   Cho b th??ng xuyn, 2-3 gi? m?t l?n. Nh?ng ng??i m? cho con b s?m v th??ng xuyn s? t g?p ph?i nh?ng v?n ?? v?i ? mu h?n.   ??t m?t gi ? l?nh ln ng?c gi?a cc l?n cho b. Lm nh? v?y s? gip gi?m s?ng t?y. B?c gi ? l?nh trong m?t chi?c kh?n nh? ?? b?o v? da b?n.   p gi nng ?m vo ng?c trong 5-10 pht tr??c m?i l?n cho b. Lm nh? v?y s? t?ng s? l?u thng v gip s?a ch?y.   Nh? nhng xoa bp ng?c tr??c v trong khi cho b.   ??m b?o r?ng b b h?t t nh?t m?t bn ng?c m?i l?n cho b tr??c khi ??i bn.   S? d?ng b?m ng?c ?? ht h?t s?a trong ng?c n?u b bu?n ng? ho?c b khng t?t. B?n c?ng c th? mu?n b?m n?u tr? l?i lm vi?c ho?c b?n c?m th?y b? ? mu.   Trnh cho con b bnh, nm v gi? ho?c cho ?n b? sung b?ng n??c ho?c n??c p tri cy thay cho b m?.   ??m b?o b ???c t? vo ng?c v ???c ??t ?ng t? th? trong khi b.   Trnh m?t m?i, c?ng th?ng v thi?u mu.   ?eo o ng?c h? tr?, trnh ki?u c dy kim lo?i.   ?n theo ch? ?? ?n cn b?ng, ?? n??c.  N?u b?n tun theo nh?ng ?? xu?t ny, s? ? mu c?a b?n s? c?i thi?n trong vng 24-48 gi?. N?u b?n v?n g?p ph?i kh kh?n, hy g?i cho chuyn gia t? v?n v? b s?a m? ho?c chuyn gia ch?m [place] y t?. CON TI C ???C B ?? S?A KHNG? ?i khi cc b m? lo khng bi?t con mnh c b  ?? s?a khng. Hy yn tm r?ng con b?n ???c b ?? s?a n?u:  B ch? ??ng mt v b?n nghe th?y ti?ng nu?t.   B b t nh?t 8-12 l?n trong kho?ng th?i gian 24 gi?Marland Kitchen Cho b b cho ??n khi b t? b? ho?c ng? trong khi b bn ng?c ??u tin (t nh?t 10-20 pht), sau ? cho b b bn th? hai.   B lm ??t 5-6 chi?c t gi?y (6-8 t v?i) trong th?i gian 24 gi? vo lc 5-6 ngy tu?i.   B ?i ngoi t nh?t 2-3 c?c phn m?i 24 gi? trong vi thng ??u tin. S?a m? l t?t c? th?c ph?m b c?n. B?n khng c?n cho b u?ng n??c ho?c s?a cng th?c. Th?c t?, ?? gip b?n c nhi?u s?a h?n, t?t nh?t b?n khng nn cho b ?n b? sung trong nh?ng tu?n ??u.   Phn c?n m?m v c mu vng.   B c?n t?ng 4-7 aox? (112-196 g) m?i tu?n sau khi b ???c 4 ngy tu?i.  T? CH?M [place] [facility] Ch?m [place] ng?c b?n b?ng cch:  T?m hng ngy.   [place] s? d?ng x phng trn nm v.   B?t ??u cho b ? ng?c bn tri trong m?t l?n cho b v ? ng?c bn ph?i trong l?n cho b k? ti?p.   B?n s? nh?n th?y ngu?n s?a t?ng trong 2-5 ngy sau khi  sinh. B?n c th? c?m th?y kh ch?u do ? mu, hi?n t??ng ny lm cho ng?c b?n r?t c?ng v th??ng nh?y c?m ?au. ? mu ?au "nh?t" trong vng 24 ??n 48 gi?Marland Kitchen Trong th?i gian ny, hy p kh?n ?m ?m ln ng?c 5-10 pht tr??c khi cho b. Nh? nhng xoa bp v v?t m?t cht s?a ra tr??c khi cho b s? lm m?m ng?c, gip b ng?m d? h?n. ?eo o ng?c ?? cho b v?a kht v th?i kh nm v trong 10-15 pht sau m?i l?n cho b.   Ch? s? d?ng mi?ng ??m o ng?c b?ng s?i bng.   Ch? s? d?ng lanolin (m? lng c?u) tinh khi?t trn nm v sau khi cho b. B?n khng c?n r?a nm v sau khi cho b.  T? ch?m [place] mnh b?ng cch:   ?n nh?ng b?a ?n cn b?ng t?t v ?? ?n dinh d??ng nh?.   U?ng s?a, n??c p tri cy v n??c ?? th?a mn c?n kht (kho?ng 8 ly/ngy).   Ngh? ng?i th?t nhi?u.   T?ng canxi trong kh?u ph?n ?n (1200mg /ngy).   Trnh nh?ng ?? ?n m b?n th?y c ?nh h??ng khng t?t ??n em b.  HY THAM V?N V?I CHUYN GIA Y T?  N?U:  B?n c b?t k? cu h?i ho?c kh kh?n no v?i vi?c cho con b.   B?n c?n tr? gip.   B?n b? m?t vng c?ng, ??, ?au trn ng?c, km theo s?t 100,5 F (38,1 C) tr? ln.   Con b?n qu bu?n ng? ?? ?n t?t ho?c kh ng?Marland Kitchen   Con b?n lm ??t d??i 6 chi?c t m?i ngy, vo lc 5 ngy tu?i.   Da ho?c lng tr?ng c?a m?t con b?n vng h?n so v?i lc ? b?nh vi?n.   B?n c?m th?y suy nh??c.  Document Released: 07/09/2005 Document Revised: 06/28/2011 [facility] Patient Information 2012 [place], [place].

## 2012-05-19 NOTE — Progress Notes (Signed)
FBS 72-100-1 out of range in last 2 wks. 2 hour pp 74-105--all in range. Doing well--

## 2012-05-28 ENCOUNTER — Ambulatory Visit (INDEPENDENT_AMBULATORY_CARE_PROVIDER_SITE_OTHER): Payer: Medicaid Other | Admitting: Obstetrics & Gynecology

## 2012-05-28 ENCOUNTER — Encounter: Payer: Self-pay | Admitting: Obstetrics & Gynecology

## 2012-05-28 VITALS — BP 94/56 | Wt 124.0 lb

## 2012-05-28 DIAGNOSIS — O09529 Supervision of elderly multigravida, unspecified trimester: Secondary | ICD-10-CM

## 2012-05-28 DIAGNOSIS — O99810 Abnormal glucose complicating pregnancy: Secondary | ICD-10-CM

## 2012-05-28 DIAGNOSIS — O479 False labor, unspecified: Secondary | ICD-10-CM

## 2012-05-28 DIAGNOSIS — O0930 Supervision of pregnancy with insufficient antenatal care, unspecified trimester: Secondary | ICD-10-CM

## 2012-05-28 DIAGNOSIS — O09522 Supervision of elderly multigravida, second trimester: Secondary | ICD-10-CM

## 2012-05-28 DIAGNOSIS — O24419 Gestational diabetes mellitus in pregnancy, unspecified control: Secondary | ICD-10-CM

## 2012-05-28 NOTE — Progress Notes (Signed)
Work in visit today for the complaint of upper/mid uterine pain since 0400 today. She denies VB, ROM. She says this does not feel like the pain she had with the delivery of her first baby (full term). She denies GI symptoms. Abd exam is benign, cervix closed. NST- reactive and no contractions seen. After the NST she tells me that her pain has resolved.     Excellent sugars in her log book (all normal). Pain precautions given.

## 2012-06-06 ENCOUNTER — Encounter: Payer: Medicaid Other | Admitting: Obstetrics & Gynecology

## 2012-06-13 ENCOUNTER — Encounter: Payer: Self-pay | Admitting: Obstetrics & Gynecology

## 2012-06-13 ENCOUNTER — Ambulatory Visit (INDEPENDENT_AMBULATORY_CARE_PROVIDER_SITE_OTHER): Payer: Medicaid Other | Admitting: Obstetrics & Gynecology

## 2012-06-13 VITALS — BP 99/63 | Wt 127.0 lb

## 2012-06-13 DIAGNOSIS — O09522 Supervision of elderly multigravida, second trimester: Secondary | ICD-10-CM

## 2012-06-13 DIAGNOSIS — IMO0002 Reserved for concepts with insufficient information to code with codable children: Secondary | ICD-10-CM

## 2012-06-13 DIAGNOSIS — O09529 Supervision of elderly multigravida, unspecified trimester: Secondary | ICD-10-CM

## 2012-06-13 NOTE — Progress Notes (Signed)
Routine visit. Cervical cultures today. Good FM. No OB problems. Labor precautions reviewed. Excellent sugars. The highest is 88.

## 2012-06-17 ENCOUNTER — Encounter: Payer: Self-pay | Admitting: Obstetrics & Gynecology

## 2012-06-17 LAB — GC/CHLAMYDIA PROBE AMP, GENITAL
Chlamydia, DNA Probe: NEGATIVE
GC Probe Amp, Genital: NEGATIVE

## 2012-06-18 ENCOUNTER — Ambulatory Visit (INDEPENDENT_AMBULATORY_CARE_PROVIDER_SITE_OTHER): Payer: Medicaid Other | Admitting: Obstetrics and Gynecology

## 2012-06-18 ENCOUNTER — Encounter: Payer: Self-pay | Admitting: Obstetrics and Gynecology

## 2012-06-18 VITALS — BP 94/63 | Wt 128.0 lb

## 2012-06-18 DIAGNOSIS — Z3490 Encounter for supervision of normal pregnancy, unspecified, unspecified trimester: Secondary | ICD-10-CM

## 2012-06-18 DIAGNOSIS — O9989 Other specified diseases and conditions complicating pregnancy, childbirth and the puerperium: Secondary | ICD-10-CM

## 2012-06-18 DIAGNOSIS — O09529 Supervision of elderly multigravida, unspecified trimester: Secondary | ICD-10-CM

## 2012-06-18 DIAGNOSIS — IMO0002 Reserved for concepts with insufficient information to code with codable children: Secondary | ICD-10-CM

## 2012-06-18 DIAGNOSIS — O0930 Supervision of pregnancy with insufficient antenatal care, unspecified trimester: Secondary | ICD-10-CM

## 2012-06-18 DIAGNOSIS — O24419 Gestational diabetes mellitus in pregnancy, unspecified control: Secondary | ICD-10-CM

## 2012-06-18 DIAGNOSIS — O99810 Abnormal glucose complicating pregnancy: Secondary | ICD-10-CM

## 2012-06-18 DIAGNOSIS — O09522 Supervision of elderly multigravida, second trimester: Secondary | ICD-10-CM

## 2012-06-18 DIAGNOSIS — Z283 Underimmunization status: Secondary | ICD-10-CM

## 2012-06-18 NOTE — Progress Notes (Signed)
Patient doing well without complaints. FM/labor precautions reviewed. Patient did not bring log book but reports CBG 87 post prandial and fasting 75

## 2012-06-18 NOTE — Progress Notes (Signed)
Is concerned if baby has turned yet.

## 2012-06-23 ENCOUNTER — Ambulatory Visit (INDEPENDENT_AMBULATORY_CARE_PROVIDER_SITE_OTHER): Payer: Medicaid Other | Admitting: Obstetrics & Gynecology

## 2012-06-23 ENCOUNTER — Encounter: Payer: Medicaid Other | Admitting: Obstetrics & Gynecology

## 2012-06-23 ENCOUNTER — Encounter: Payer: Self-pay | Admitting: Obstetrics & Gynecology

## 2012-06-23 VITALS — BP 97/65 | Wt 127.0 lb

## 2012-06-23 DIAGNOSIS — O09529 Supervision of elderly multigravida, unspecified trimester: Secondary | ICD-10-CM

## 2012-06-23 DIAGNOSIS — O0930 Supervision of pregnancy with insufficient antenatal care, unspecified trimester: Secondary | ICD-10-CM

## 2012-06-23 DIAGNOSIS — O09522 Supervision of elderly multigravida, second trimester: Secondary | ICD-10-CM

## 2012-06-23 DIAGNOSIS — O24419 Gestational diabetes mellitus in pregnancy, unspecified control: Secondary | ICD-10-CM

## 2012-06-23 DIAGNOSIS — O99810 Abnormal glucose complicating pregnancy: Secondary | ICD-10-CM

## 2012-06-23 NOTE — Progress Notes (Signed)
Came in today for contractions and increased pelvic pressure.  Denies any leaking of fluid.

## 2012-06-23 NOTE — Progress Notes (Signed)
Work in visit due to CTXs that started this morning. She describes the pain as "not bad". She denies VB, ROM. She reports good FM. Labor precautions reviewed

## 2012-06-27 ENCOUNTER — Encounter: Payer: Medicaid Other | Admitting: Obstetrics and Gynecology

## 2012-06-29 ENCOUNTER — Encounter (HOSPITAL_COMMUNITY): Payer: Self-pay | Admitting: Family

## 2012-06-29 ENCOUNTER — Inpatient Hospital Stay (HOSPITAL_COMMUNITY)
Admission: AD | Admit: 2012-06-29 | Discharge: 2012-07-03 | DRG: 765 | Disposition: A | Discharge status: Home / Self Care | Payer: Medicaid Other | Source: Ambulatory Visit | Attending: Obstetrics & Gynecology | Admitting: Obstetrics & Gynecology

## 2012-06-29 DIAGNOSIS — O864 Pyrexia of unknown origin following delivery: Secondary | ICD-10-CM | POA: Diagnosis not present

## 2012-06-29 DIAGNOSIS — Z98891 History of uterine scar from previous surgery: Secondary | ICD-10-CM

## 2012-06-29 DIAGNOSIS — O99814 Abnormal glucose complicating childbirth: Secondary | ICD-10-CM | POA: Diagnosis present

## 2012-06-29 DIAGNOSIS — O09522 Supervision of elderly multigravida, second trimester: Secondary | ICD-10-CM

## 2012-06-29 DIAGNOSIS — O09529 Supervision of elderly multigravida, unspecified trimester: Secondary | ICD-10-CM | POA: Diagnosis present

## 2012-06-29 DIAGNOSIS — IMO0002 Reserved for concepts with insufficient information to code with codable children: Secondary | ICD-10-CM

## 2012-06-29 DIAGNOSIS — O324XX0 Maternal care for high head at term, not applicable or unspecified: Secondary | ICD-10-CM | POA: Diagnosis present

## 2012-06-29 DIAGNOSIS — Z283 Underimmunization status: Secondary | ICD-10-CM

## 2012-06-29 DIAGNOSIS — O24419 Gestational diabetes mellitus in pregnancy, unspecified control: Secondary | ICD-10-CM

## 2012-06-29 DIAGNOSIS — IMO0001 Reserved for inherently not codable concepts without codable children: Secondary | ICD-10-CM

## 2012-06-29 DIAGNOSIS — Z789 Other specified health status: Secondary | ICD-10-CM

## 2012-06-29 HISTORY — DX: Gestational diabetes mellitus in pregnancy, unspecified control: O24.419

## 2012-06-29 LAB — CBC
HCT: 37.8 % (ref 36.0–46.0)
Hemoglobin: 12.4 g/dL (ref 12.0–15.0)
MCH: 30.4 pg (ref 26.0–34.0)
MCHC: 32.8 g/dL (ref 30.0–36.0)
RBC: 4.08 MIL/uL (ref 3.87–5.11)

## 2012-06-29 LAB — TYPE AND SCREEN: ABO/RH(D): AB POS

## 2012-06-29 LAB — GLUCOSE, CAPILLARY: Glucose-Capillary: 71 mg/dL (ref 70–99)

## 2012-06-29 MED ORDER — OXYTOCIN BOLUS FROM INFUSION: 500.0000 mL | INTRAVENOUS | Status: DC

## 2012-06-29 MED ORDER — OXYCODONE-ACETAMINOPHEN 5-325 MG PO TABS: 1.0000 | ORAL_TABLET | ORAL | Status: DC | PRN

## 2012-06-29 MED ORDER — OXYTOCIN 40 UNITS IN LACTATED RINGERS INFUSION - SIMPLE MED
1.0000 m[IU]/min | INTRAVENOUS | Status: DC
  Administered 2012-06-29: 1 m[IU]/min via INTRAVENOUS

## 2012-06-29 MED ORDER — TERBUTALINE SULFATE 1 MG/ML IJ SOLN: 0.2500 mg | Freq: Once | INTRAMUSCULAR | Status: AC | PRN

## 2012-06-29 MED ORDER — ONDANSETRON HCL 4 MG/2ML IJ SOLN: 4.0000 mg | Freq: Four times a day (QID) | INTRAMUSCULAR | Status: DC | PRN

## 2012-06-29 MED ORDER — OXYTOCIN 40 UNITS IN LACTATED RINGERS INFUSION - SIMPLE MED
62.5000 mL/h | INTRAVENOUS | Status: DC
  Filled 2012-06-29: qty 1000

## 2012-06-29 MED ORDER — LIDOCAINE HCL (PF) 1 % IJ SOLN
30.0000 mL | INTRAMUSCULAR | Status: DC | PRN
  Filled 2012-06-29: qty 30

## 2012-06-29 MED ORDER — IBUPROFEN 600 MG PO TABS: 600.0000 mg | ORAL_TABLET | Freq: Four times a day (QID) | ORAL | Status: DC | PRN

## 2012-06-29 MED ORDER — FLEET ENEMA 7-19 GM/118ML RE ENEM: 1.0000 | ENEMA | RECTAL | Status: DC | PRN

## 2012-06-29 MED ORDER — CITRIC ACID-SODIUM CITRATE 334-500 MG/5ML PO SOLN
30.0000 mL | ORAL | Status: DC | PRN
  Administered 2012-06-30: 30 mL via ORAL
  Filled 2012-06-29: qty 15

## 2012-06-29 MED ORDER — LACTATED RINGERS IV SOLN
INTRAVENOUS | Status: DC
  Administered 2012-06-29 – 2012-06-30 (×3): via INTRAVENOUS

## 2012-06-29 MED ORDER — FENTANYL CITRATE 0.05 MG/ML IJ SOLN
100.0000 ug | INTRAMUSCULAR | Status: DC | PRN
  Administered 2012-06-29 (×2): 100 ug via INTRAVENOUS
  Filled 2012-06-29 (×2): qty 2

## 2012-06-29 MED ORDER — LACTATED RINGERS IV SOLN: 500.0000 mL | INTRAVENOUS | Status: DC | PRN

## 2012-06-29 MED ORDER — ACETAMINOPHEN 325 MG PO TABS: 650.0000 mg | ORAL_TABLET | ORAL | Status: DC | PRN

## 2012-06-29 NOTE — MAU Note (Signed)
Patient states she is having contractions every 3-5 minutes with bloody show. Denies leaking.

## 2012-06-29 NOTE — Progress Notes (Signed)
   Subjective: Pt reports increased pain with contractions.  No request for pain medication.  Objective: BP 98/65  Pulse 91  Temp 98.3 F (36.8 C) (Oral)  Resp 19  Ht 4\' 11"  (1.499 m)  Wt 58.423 kg (128 lb 12.8 oz)  BMI 26.01 kg/m2  LMP 10/01/2011      FHT:  FHR: 145 bpm, variability: moderate,  accelerations:  Present,  decelerations:  Absent UC:   irregular, every 3-5 minutes SVE:   Dilation: 3-4 Effacement (%): 90 Station: Ballotable Exam by:: Roney Marion, CNM  Labs: Lab Results  Component Value Date   WBC 12.0* 06/29/2012   HGB 12.4 06/29/2012   HCT 37.8 06/29/2012   MCV 92.6 06/29/2012   PLT 274 06/29/2012    Assessment / Plan: Spontaneous Labor  Labor: Spontaneous Labor Preeclampsia:  n/a Fetal Wellbeing:  Category I Pain Control:  Labor support without medications I/D:  n/a Anticipated MOD:  NSVD  [facility] [place] 06/29/2012, 4:04 PM

## 2012-06-29 NOTE — Progress Notes (Signed)
LATE ENTRY OF NOTE. PT EVALUATED AROUND 1715.  Brandy Suarez is a 41 y.o. G3P1011 at [redacted]w[redacted]d  Subjective: Pain with contractions but no request for pain medicine "until it gets unbearable" (per family interpreting)  Objective: BP 127/63  Pulse 83  Temp 98.3 F (36.8 C) (Oral)  Resp 21  Ht 4\' 11"  (1.499 m)  Wt 58.423 kg (128 lb 12.8 oz)  BMI 26.01 kg/m2  LMP 10/01/2011      FHT:  FHR: 140 bpm, variability: moderate,  accelerations:  Present,  decelerations:  Absent UC:   regular, every 3-5 minutes SVE:   Dilation: 4 Effacement (%): 90 Station: Ballotable Exam by:: Dr Casper Harrison  Labs: Lab Results  Component Value Date   WBC 12.0* 06/29/2012   HGB 12.4 06/29/2012   HCT 37.8 06/29/2012   MCV 92.6 06/29/2012   PLT 274 06/29/2012    Assessment / Plan: Augmentation of labor, progressing well  Labor: Progressing slowly, on Pit; will monitor. Fetal Wellbeing:  Category I Pain Control:  Labor support without medications; ordered for Fentanyl IV or epidural per pt preference, if requests Anticipated MOD:  NSVD  Street, Cristal [place]er 06/29/2012, 6:16 PM

## 2012-06-29 NOTE — MAU Note (Signed)
Patient reports that contractions began at 0600 today. Denies leaking, only bloody show.

## 2012-06-29 NOTE — H&P (Signed)
I examined pt and agree with documentation above and resident plan of care. Brandy Suarez,Brandy Suarez  

## 2012-06-29 NOTE — Progress Notes (Addendum)
Brandy Suarez is a 41 y.o. G3P1011 at [redacted]w[redacted]d admitted for active labor  Subjective: Pt doing well with contractions, s/o and support person at her side.    Objective: BP 129/75  Pulse 84  Temp 98.3 F (36.8 C) (Oral)  Resp 18  Ht 4\' 11"  (1.499 m)  Wt 58.423 kg (128 lb 12.8 oz)  BMI 26.01 kg/m2  LMP 10/01/2011      FHT:  FHR: 135 bpm, variability: moderate,  accelerations:  Present,  decelerations:  Present early and occasional variables UC:   regular, every 3 minutes SVE:   Dilation: 8 Effacement (%): 100 Station: 0 Exam by:: C. Blackstock, RN AROM, light meconium  Cervix 8/100/0 after AROM  Pt unable to walk to bathroom.   Encouraged pt to empty her bladder on bedpan, unable to empty bladder at this time   Fetal head noted to be in ROT position, pt turned to right, then left sides to assist rotation/descent, encouraged frequent maternal position change  Labs: Lab Results  Component Value Date   WBC 12.0* 06/29/2012   HGB 12.4 06/29/2012   HCT 37.8 06/29/2012   MCV 92.6 06/29/2012   PLT 274 06/29/2012    Assessment / Plan: Augmentation of labor, progressing well  Labor: Progressing normally  Fetal Wellbeing:  Category I, Overall Category I tracing with only occasional variable decel Pain Control:  Fentanyl  Anticipated MOD:  NSVD  LEFTWICH-KIRBY, LISA 06/29/2012, 10:23 PM

## 2012-06-29 NOTE — H&P (Addendum)
Brandy Suarez is a 41 y.o. female G3P1011 at [redacted]w[redacted]d by LMP presenting for onset of labor. Mother in law acts as interpreter (pt speaks [place] only). Reports contractions since 0600. Denies LOF or gush. Good fetal movements.  Denies headache, change in vision, RUQ pain, fever/chills, nausea/vomiting, new/worse swelling.  Pt seen in [facility] for AMA and gestational diabetes (class A1 with good diet control). Otherwise no complications, but late to care at 26 weeks. [place] screen negative.  Maternal Medical History:  Reason for admission: Reason for admission: contractions.  Contractions: Onset was 6-12 hours ago.   Frequency: regular.   Duration is approximately 5 minutes.   Perceived severity is strong.    Fetal activity: Perceived fetal activity is normal.   Last perceived fetal movement was within the past hour.    Prenatal Complications - Diabetes: gestational. Diabetes is managed by diet.      OB History    Grav Para Term Preterm Abortions TAB SAB Ect Mult Living   3 1 1  1  1   1      Past Medical History  Diagnosis Date  . No pertinent past medical history   . Gestational diabetes     GDM class A1   Past Surgical History  Procedure Date  . Wisdom tooth extraction    Family History: family history is negative for Other. Social History:  reports that she has never smoked. She has never used smokeless tobacco. She reports that she does not drink alcohol or use illicit drugs.  ROS: See HPI  Dilation: 3 Effacement (%): 90 Station: Ballotable Exam by:: C. Millican, RN  Blood pressure 101/66, pulse 98, temperature 98.3 F (36.8 C), temperature source Oral, resp. rate 16, height 4\' 11"  (1.499 m), weight 58.423 kg (128 lb 12.8 oz), last menstrual period 10/01/2011. Maternal Exam:  Uterine Assessment: Contraction strength is moderate.  Contraction duration is 5 seconds. Contraction frequency is regular.   Abdomen: Fetal presentation: vertex  Introitus: not evaluated.    Ferning test: not done.  Nitrazine test: not done. Amniotic fluid character: not assessed.  Pelvis: adequate for delivery.   Cervix: Cervix evaluated by digital exam.     Fetal Exam Fetal Monitor Review: Mode: ultrasound.   Baseline rate: 140.  Variability: moderate (6-25 bpm).   Pattern: accelerations present and no decelerations.    Fetal State Assessment: Category I - tracings are normal.     Physical Exam  Vitals reviewed. Constitutional: She is oriented to person, place, and time. She appears well-developed and well-nourished. No distress.  HENT:  Head: Normocephalic and atraumatic.  Eyes: Conjunctivae normal are normal. Pupils are equal, round, and reactive to light.  Neck: Normal range of motion. Neck supple.  Cardiovascular: Normal rate, regular rhythm and normal heart sounds.   No murmur heard. Respiratory: Effort normal and breath sounds normal. She has no wheezes.  GI: Soft. Bowel sounds are normal. She exhibits no distension. There is no tenderness. There is no guarding.       Gravid  Genitourinary: Pelvic exam was performed with patient prone.       Dilation: 3 Effacement (%): 90 Cervical Position: Posterior Station: Ballotable Presentation: Vertex Exam by:: Harvin Hazel, RN   Musculoskeletal: Normal range of motion.  Neurological: She is alert and oriented to person, place, and time.  Skin: Skin is warm and dry. No erythema.  Psychiatric: She has a normal mood and affect. Her behavior is normal.    Prenatal labs: ABO, Rh: AB/POS/-- (09/11  1056) Antibody: NEG (09/11 1056) Rubella: 8.0 (09/11 1056) RPR: NON REAC (09/18 1524)  HBsAg: NEGATIVE (09/11 1056)  HIV: NON REACTIVE (09/18 1524)  GBS:   negative  Assessment/Plan: 41 y.o. G3P1011 at [redacted]w[redacted]d -SIUP in early active labor; pt speaks [place] only, mother in law acts as interpreter  -admit to birthing suites  -routine labor and delivery orders  -IV pain meds or epidural per pt preference  The  above was discussed with Roney Marion, CNM.  Street, Christopher 06/29/2012, 2:18 PM  I examined pt and agree with documentation above and resident plan of care. [facility]

## 2012-06-30 ENCOUNTER — Inpatient Hospital Stay (HOSPITAL_COMMUNITY): Payer: Medicaid Other

## 2012-06-30 ENCOUNTER — Encounter (HOSPITAL_COMMUNITY)
Admission: AD | Disposition: A | Discharge status: Home / Self Care | Payer: Self-pay | Source: Ambulatory Visit | Attending: Obstetrics & Gynecology

## 2012-06-30 ENCOUNTER — Encounter (HOSPITAL_COMMUNITY): Payer: Self-pay | Admitting: *Deleted

## 2012-06-30 ENCOUNTER — Encounter (HOSPITAL_COMMUNITY): Payer: Self-pay | Admitting: Anesthesiology

## 2012-06-30 ENCOUNTER — Inpatient Hospital Stay (HOSPITAL_COMMUNITY): Payer: Medicaid Other | Admitting: Anesthesiology

## 2012-06-30 DIAGNOSIS — O864 Pyrexia of unknown origin following delivery: Secondary | ICD-10-CM

## 2012-06-30 DIAGNOSIS — O99814 Abnormal glucose complicating childbirth: Secondary | ICD-10-CM

## 2012-06-30 DIAGNOSIS — O324XX0 Maternal care for high head at term, not applicable or unspecified: Secondary | ICD-10-CM

## 2012-06-30 LAB — RPR: RPR Ser Ql: NONREACTIVE

## 2012-06-30 LAB — GLUCOSE, CAPILLARY: Glucose-Capillary: 132 mg/dL — ABNORMAL HIGH (ref 70–99)

## 2012-06-30 SURGERY — Surgical Case
Anesthesia: Epidural | Site: Abdomen | Wound class: Clean Contaminated

## 2012-06-30 MED ORDER — GENTAMICIN SULFATE 40 MG/ML IJ SOLN: 5.0000 mg/kg | INTRAVENOUS | Status: DC

## 2012-06-30 MED ORDER — LACTATED RINGERS IV SOLN
INTRAVENOUS | Status: DC | PRN
  Administered 2012-06-30 (×3): via INTRAVENOUS

## 2012-06-30 MED ORDER — GENTAMICIN SULFATE 40 MG/ML IJ SOLN
Freq: Once | INTRAVENOUS | Status: AC
  Administered 2012-06-30: 16:00:00 via INTRAVENOUS
  Filled 2012-06-30 (×2): qty 7.5

## 2012-06-30 MED ORDER — FENTANYL 2.5 MCG/ML BUPIVACAINE 1/10 % EPIDURAL INFUSION (WH - ANES)
INTRAMUSCULAR | Status: DC | PRN
  Administered 2012-06-30: 11 mL/h via EPIDURAL

## 2012-06-30 MED ORDER — OXYTOCIN 10 UNIT/ML IJ SOLN
INTRAMUSCULAR | Status: AC
  Filled 2012-06-30: qty 4

## 2012-06-30 MED ORDER — KETOROLAC TROMETHAMINE 30 MG/ML IJ SOLN
30.0000 mg | Freq: Four times a day (QID) | INTRAMUSCULAR | Status: AC | PRN
  Administered 2012-06-30: 30 mg via INTRAVENOUS
  Filled 2012-06-30: qty 1

## 2012-06-30 MED ORDER — METHYLERGONOVINE MALEATE 0.2 MG/ML IJ SOLN
INTRAMUSCULAR | Status: DC | PRN
  Administered 2012-06-30: 0.2 mg via INTRAMUSCULAR

## 2012-06-30 MED ORDER — SIMETHICONE 80 MG PO CHEW
80.0000 mg | CHEWABLE_TABLET | ORAL | Status: DC | PRN
  Administered 2012-06-30 (×2): 80 mg via ORAL

## 2012-06-30 MED ORDER — METOCLOPRAMIDE HCL 5 MG/ML IJ SOLN: 10.0000 mg | Freq: Three times a day (TID) | INTRAMUSCULAR | Status: DC | PRN

## 2012-06-30 MED ORDER — LIDOCAINE-EPINEPHRINE (PF) 2 %-1:200000 IJ SOLN
INTRAMUSCULAR | Status: AC
  Filled 2012-06-30: qty 20

## 2012-06-30 MED ORDER — SCOPOLAMINE 1 MG/3DAYS TD PT72
MEDICATED_PATCH | TRANSDERMAL | Status: AC
  Administered 2012-06-30: 1.5 mg via TRANSDERMAL
  Filled 2012-06-30: qty 1

## 2012-06-30 MED ORDER — ONDANSETRON HCL 4 MG/2ML IJ SOLN
INTRAMUSCULAR | Status: DC | PRN
  Administered 2012-06-30: 4 mg via INTRAVENOUS

## 2012-06-30 MED ORDER — PRENATAL MULTIVITAMIN CH
1.0000 | ORAL_TABLET | Freq: Every day | ORAL | Status: DC
  Administered 2012-07-01 – 2012-07-03 (×3): 1 via ORAL
  Filled 2012-06-30 (×3): qty 1

## 2012-06-30 MED ORDER — INSULIN ASPART 100 UNIT/ML ~~LOC~~ SOLN
2.0000 [IU] | SUBCUTANEOUS | Status: AC
  Administered 2012-06-30: 2 [IU] via SUBCUTANEOUS

## 2012-06-30 MED ORDER — SODIUM BICARBONATE 8.4 % IV SOLN
INTRAVENOUS | Status: DC | PRN
  Administered 2012-06-30: 5 mL via EPIDURAL

## 2012-06-30 MED ORDER — LACTATED RINGERS IV SOLN
INTRAVENOUS | Status: DC | PRN
  Administered 2012-06-30: 07:00:00 via INTRAVENOUS

## 2012-06-30 MED ORDER — SCOPOLAMINE 1 MG/3DAYS TD PT72
1.0000 | MEDICATED_PATCH | Freq: Once | TRANSDERMAL | Status: AC
  Administered 2012-06-30: 1.5 mg via TRANSDERMAL

## 2012-06-30 MED ORDER — OXYTOCIN 10 UNIT/ML IJ SOLN
40.0000 [IU] | INTRAVENOUS | Status: DC | PRN
  Administered 2012-06-30: 40 [IU] via INTRAVENOUS

## 2012-06-30 MED ORDER — FENTANYL 2.5 MCG/ML BUPIVACAINE 1/10 % EPIDURAL INFUSION (WH - ANES)
14.0000 mL/h | INTRAMUSCULAR | Status: DC
  Filled 2012-06-30: qty 125

## 2012-06-30 MED ORDER — FENTANYL CITRATE 0.05 MG/ML IJ SOLN
INTRAMUSCULAR | Status: AC
  Filled 2012-06-30: qty 2

## 2012-06-30 MED ORDER — DIBUCAINE 1 % RE OINT: 1.0000 "application " | TOPICAL_OINTMENT | RECTAL | Status: DC | PRN

## 2012-06-30 MED ORDER — SODIUM CHLORIDE 0.9 % IV SOLN: INTRAVENOUS | Status: DC

## 2012-06-30 MED ORDER — NALBUPHINE HCL 10 MG/ML IJ SOLN
5.0000 mg | INTRAMUSCULAR | Status: DC | PRN
  Filled 2012-06-30: qty 1

## 2012-06-30 MED ORDER — SODIUM CHLORIDE 0.9 % IV SOLN
INTRAVENOUS | Status: DC
  Filled 2012-06-30: qty 1

## 2012-06-30 MED ORDER — SODIUM CHLORIDE 0.9 % IJ SOLN
3.0000 mL | INTRAMUSCULAR | Status: DC | PRN
  Administered 2012-07-01: 3 mL via INTRAVENOUS

## 2012-06-30 MED ORDER — DIPHENHYDRAMINE HCL 50 MG/ML IJ SOLN: 12.5000 mg | INTRAMUSCULAR | Status: DC | PRN

## 2012-06-30 MED ORDER — FENTANYL CITRATE 0.05 MG/ML IJ SOLN: 25.0000 ug | INTRAMUSCULAR | Status: DC | PRN

## 2012-06-30 MED ORDER — WITCH HAZEL-GLYCERIN EX PADS: 1.0000 "application " | MEDICATED_PAD | CUTANEOUS | Status: DC | PRN

## 2012-06-30 MED ORDER — PHENYLEPHRINE 40 MCG/ML (10ML) SYRINGE FOR IV PUSH (FOR BLOOD PRESSURE SUPPORT)
PREFILLED_SYRINGE | INTRAVENOUS | Status: AC
  Filled 2012-06-30: qty 10

## 2012-06-30 MED ORDER — MORPHINE SULFATE (PF) 0.5 MG/ML IJ SOLN
INTRAMUSCULAR | Status: DC | PRN
  Administered 2012-06-30: 4 mg via EPIDURAL

## 2012-06-30 MED ORDER — OXYCODONE-ACETAMINOPHEN 5-325 MG PO TABS: 1.0000 | ORAL_TABLET | ORAL | Status: DC | PRN

## 2012-06-30 MED ORDER — DIPHENHYDRAMINE HCL 50 MG/ML IJ SOLN: 25.0000 mg | INTRAMUSCULAR | Status: DC | PRN

## 2012-06-30 MED ORDER — IBUPROFEN 600 MG PO TABS: 600.0000 mg | ORAL_TABLET | Freq: Four times a day (QID) | ORAL | Status: DC | PRN

## 2012-06-30 MED ORDER — DEXTROSE-NACL 5-0.45 % IV SOLN: INTRAVENOUS | Status: DC

## 2012-06-30 MED ORDER — MORPHINE SULFATE 0.5 MG/ML IJ SOLN
INTRAMUSCULAR | Status: AC
  Filled 2012-06-30: qty 10

## 2012-06-30 MED ORDER — DIPHENHYDRAMINE HCL 25 MG PO CAPS: 25.0000 mg | ORAL_CAPSULE | Freq: Four times a day (QID) | ORAL | Status: DC | PRN

## 2012-06-30 MED ORDER — LACTATED RINGERS IV SOLN
500.0000 mL | Freq: Once | INTRAVENOUS | Status: AC
  Administered 2012-06-30: 500 mL via INTRAVENOUS

## 2012-06-30 MED ORDER — LANOLIN HYDROUS EX OINT: 1.0000 "application " | TOPICAL_OINTMENT | CUTANEOUS | Status: DC | PRN

## 2012-06-30 MED ORDER — DIPHENHYDRAMINE HCL 50 MG/ML IJ SOLN
INTRAMUSCULAR | Status: DC | PRN
  Administered 2012-06-30: 25 mg via INTRAVENOUS

## 2012-06-30 MED ORDER — SODIUM BICARBONATE 8.4 % IV SOLN
INTRAVENOUS | Status: AC
  Filled 2012-06-30: qty 50

## 2012-06-30 MED ORDER — DEXTROSE 50 % IV SOLN: 25.0000 mL | INTRAVENOUS | Status: DC | PRN

## 2012-06-30 MED ORDER — ONDANSETRON HCL 4 MG PO TABS: 4.0000 mg | ORAL_TABLET | ORAL | Status: DC | PRN

## 2012-06-30 MED ORDER — PHENYLEPHRINE 40 MCG/ML (10ML) SYRINGE FOR IV PUSH (FOR BLOOD PRESSURE SUPPORT)
80.0000 ug | PREFILLED_SYRINGE | INTRAVENOUS | Status: DC | PRN
  Filled 2012-06-30: qty 5

## 2012-06-30 MED ORDER — ONDANSETRON HCL 4 MG/2ML IJ SOLN: 4.0000 mg | Freq: Three times a day (TID) | INTRAMUSCULAR | Status: DC | PRN

## 2012-06-30 MED ORDER — INSULIN REGULAR BOLUS VIA INFUSION
0.0000 [IU] | Freq: Three times a day (TID) | INTRAVENOUS | Status: DC
  Filled 2012-06-30: qty 10

## 2012-06-30 MED ORDER — MENTHOL 3 MG MT LOZG: 1.0000 | LOZENGE | OROMUCOSAL | Status: DC | PRN

## 2012-06-30 MED ORDER — CEFAZOLIN SODIUM-DEXTROSE 2-3 GM-% IV SOLR
INTRAVENOUS | Status: AC
  Filled 2012-06-30: qty 50

## 2012-06-30 MED ORDER — IBUPROFEN 600 MG PO TABS
600.0000 mg | ORAL_TABLET | Freq: Four times a day (QID) | ORAL | Status: DC
  Administered 2012-06-30 – 2012-07-03 (×12): 600 mg via ORAL
  Filled 2012-06-30 (×12): qty 1

## 2012-06-30 MED ORDER — ONDANSETRON HCL 4 MG/2ML IJ SOLN
INTRAMUSCULAR | Status: AC
  Filled 2012-06-30: qty 2

## 2012-06-30 MED ORDER — TETANUS-DIPHTH-ACELL PERTUSSIS 5-2.5-18.5 LF-MCG/0.5 IM SUSP: 0.5000 mL | Freq: Once | INTRAMUSCULAR | Status: DC

## 2012-06-30 MED ORDER — OXYTOCIN 40 UNITS IN LACTATED RINGERS INFUSION - SIMPLE MED: 62.5000 mL/h | INTRAVENOUS | Status: AC

## 2012-06-30 MED ORDER — LACTATED RINGERS IV SOLN
INTRAVENOUS | Status: DC
  Administered 2012-06-30: 18:00:00 via INTRAVENOUS

## 2012-06-30 MED ORDER — KETOROLAC TROMETHAMINE 30 MG/ML IJ SOLN: 30.0000 mg | Freq: Four times a day (QID) | INTRAMUSCULAR | Status: AC | PRN

## 2012-06-30 MED ORDER — EPHEDRINE 5 MG/ML INJ
10.0000 mg | INTRAVENOUS | Status: DC | PRN
  Filled 2012-06-30: qty 4

## 2012-06-30 MED ORDER — MEASLES, MUMPS & RUBELLA VAC ~~LOC~~ INJ
0.5000 mL | INJECTION | Freq: Once | SUBCUTANEOUS | Status: AC
  Administered 2012-07-03: 0.5 mL via SUBCUTANEOUS
  Filled 2012-06-30 (×2): qty 0.5

## 2012-06-30 MED ORDER — PHENYLEPHRINE 40 MCG/ML (10ML) SYRINGE FOR IV PUSH (FOR BLOOD PRESSURE SUPPORT): 80.0000 ug | PREFILLED_SYRINGE | INTRAVENOUS | Status: DC | PRN

## 2012-06-30 MED ORDER — ONDANSETRON HCL 4 MG/2ML IJ SOLN: 4.0000 mg | INTRAMUSCULAR | Status: DC | PRN

## 2012-06-30 MED ORDER — METHYLERGONOVINE MALEATE 0.2 MG/ML IJ SOLN
INTRAMUSCULAR | Status: AC
  Filled 2012-06-30: qty 1

## 2012-06-30 MED ORDER — LIDOCAINE HCL (PF) 1 % IJ SOLN
INTRAMUSCULAR | Status: DC | PRN
  Administered 2012-06-30: 3 mL
  Administered 2012-06-30: 4 mL

## 2012-06-30 MED ORDER — CLINDAMYCIN PHOSPHATE 900 MG/50ML IV SOLN
900.0000 mg | Freq: Three times a day (TID) | INTRAVENOUS | Status: AC
  Administered 2012-06-30 – 2012-07-01 (×2): 900 mg via INTRAVENOUS
  Filled 2012-06-30 (×3): qty 50

## 2012-06-30 MED ORDER — CEFAZOLIN SODIUM-DEXTROSE 2-3 GM-% IV SOLR
INTRAVENOUS | Status: DC | PRN
  Administered 2012-06-30: 2 g via INTRAVENOUS

## 2012-06-30 MED ORDER — NALOXONE HCL 1 MG/ML IJ SOLN
1.0000 ug/kg/h | INTRAVENOUS | Status: DC | PRN
  Filled 2012-06-30: qty 2

## 2012-06-30 MED ORDER — EPHEDRINE 5 MG/ML INJ: 10.0000 mg | INTRAVENOUS | Status: DC | PRN

## 2012-06-30 MED ORDER — CEFAZOLIN SODIUM-DEXTROSE 2-3 GM-% IV SOLR
2.0000 g | Freq: Once | INTRAVENOUS | Status: DC
  Filled 2012-06-30: qty 50

## 2012-06-30 MED ORDER — NALOXONE HCL 0.4 MG/ML IJ SOLN: 0.4000 mg | INTRAMUSCULAR | Status: DC | PRN

## 2012-06-30 MED ORDER — DIPHENHYDRAMINE HCL 25 MG PO CAPS: 25.0000 mg | ORAL_CAPSULE | ORAL | Status: DC | PRN

## 2012-06-30 MED ORDER — MEPERIDINE HCL 25 MG/ML IJ SOLN: 6.2500 mg | INTRAMUSCULAR | Status: DC | PRN

## 2012-06-30 MED ORDER — PHENYLEPHRINE HCL 10 MG/ML IJ SOLN
INTRAMUSCULAR | Status: DC | PRN
  Administered 2012-06-30: 40 ug via INTRAVENOUS
  Administered 2012-06-30 (×3): 80 ug via INTRAVENOUS
  Administered 2012-06-30: 40 ug via INTRAVENOUS
  Administered 2012-06-30: 80 ug via INTRAVENOUS
  Administered 2012-06-30 (×2): 40 ug via INTRAVENOUS
  Administered 2012-06-30: 80 ug via INTRAVENOUS

## 2012-06-30 SURGICAL SUPPLY — 33 items
BENZOIN TINCTURE PRP APPL 2/3 (GAUZE/BANDAGES/DRESSINGS) ×2 IMPLANT
CLOTH BEACON ORANGE TIMEOUT ST (SAFETY) ×2 IMPLANT
CONTAINER PREFILL 10% NBF 15ML (MISCELLANEOUS) IMPLANT
DRAIN JACKSON PRT FLT 7MM (DRAIN) IMPLANT
DRESSING TELFA 8X3 (GAUZE/BANDAGES/DRESSINGS) IMPLANT
DRSG OPSITE 6X11 MED (GAUZE/BANDAGES/DRESSINGS) ×2 IMPLANT
DRSG OPSITE POSTOP 4X10 (GAUZE/BANDAGES/DRESSINGS) IMPLANT
DURAPREP 26ML APPLICATOR (WOUND CARE) ×2 IMPLANT
ELECT REM PT RETURN 9FT ADLT (ELECTROSURGICAL) ×2
ELECTRODE REM PT RTRN 9FT ADLT (ELECTROSURGICAL) ×1 IMPLANT
EVACUATOR SILICONE 100CC (DRAIN) IMPLANT
EXTRACTOR VACUUM M CUP 4 TUBE (SUCTIONS) IMPLANT
GAUZE SPONGE 4X4 12PLY STRL LF (GAUZE/BANDAGES/DRESSINGS) ×4 IMPLANT
GLOVE BIO SURGEON STRL SZ7 (GLOVE) ×2 IMPLANT
GLOVE BIOGEL PI IND STRL 7.0 (GLOVE) ×2 IMPLANT
GLOVE BIOGEL PI INDICATOR 7.0 (GLOVE) ×2
GOWN PREVENTION PLUS LG XLONG (DISPOSABLE) ×6 IMPLANT
KIT ABG SYR 3ML LUER SLIP (SYRINGE) IMPLANT
NEEDLE HYPO 25X5/8 SAFETYGLIDE (NEEDLE) ×2 IMPLANT
NS IRRIG 1000ML POUR BTL (IV SOLUTION) ×2 IMPLANT
PACK C SECTION WH (CUSTOM PROCEDURE TRAY) ×2 IMPLANT
PAD ABD 7.5X8 STRL (GAUZE/BANDAGES/DRESSINGS) IMPLANT
PAD OB MATERNITY 4.3X12.25 (PERSONAL CARE ITEMS) IMPLANT
RTRCTR C-SECT PINK 25CM LRG (MISCELLANEOUS) ×4 IMPLANT
SLEEVE SCD COMPRESS KNEE MED (MISCELLANEOUS) IMPLANT
STAPLER VISISTAT 35W (STAPLE) IMPLANT
STRIP CLOSURE SKIN 1/2X4 (GAUZE/BANDAGES/DRESSINGS) ×2 IMPLANT
SUT VIC AB 0 CTX 36 (SUTURE) ×5
SUT VIC AB 0 CTX36XBRD ANBCTRL (SUTURE) ×5 IMPLANT
SUT VIC AB 4-0 KS 27 (SUTURE) IMPLANT
TOWEL OR 17X24 6PK STRL BLUE (TOWEL DISPOSABLE) ×4 IMPLANT
TRAY FOLEY CATH 14FR (SET/KITS/TRAYS/PACK) ×2 IMPLANT
WATER STERILE IRR 1000ML POUR (IV SOLUTION) ×2 IMPLANT

## 2012-06-30 NOTE — Progress Notes (Signed)
Brandy Suarez is a 41 y.o. G3P1011 at [redacted]w[redacted]d by admitted for active labor  Subjective: Pt comfortable with epidural.  Does report mild rectal pressure with contractions.  Family members at bedside at this time.  Objective: BP 107/56  Pulse 100  Temp 98.8 F (37.1 C) (Oral)  Resp 16  Ht 4\' 11"  (1.499 m)  Wt 58.423 kg (128 lb 12.8 oz)  BMI 26.01 kg/m2  SpO2 98%  LMP 10/01/2011   Total I/O In: -  Out: 25 [Urine:25]  FHT:  FHR: 130 bpm, variability: moderate,  accelerations:  Present,  decelerations:  Absent UC:   regular, every 3 minutes SVE:   10/100/2   Labs: Lab Results  Component Value Date   WBC 12.0* 06/29/2012   HGB 12.4 06/29/2012   HCT 37.8 06/29/2012   MCV 92.6 06/29/2012   PLT 274 06/29/2012    Assessment / Plan: Augmentation of labor, progressing well Pt comfortable with epidural and rested/labored down for 45 min -1 hour Plan to resume pushing   Labor: Progressing normally  Fetal Wellbeing:  Category I Pain Control:  Epidural  Anticipated MOD:  NSVD  LEFTWICH-KIRBY, LISA 06/30/2012, 5:00 AM

## 2012-06-30 NOTE — Progress Notes (Signed)
Patient ID: Brandy Suarez, female   DOB: 02/16/1971, 41 y.o.   MRN: [id number]   S:  Called by RN for post-op temp 100.7 at 9:00 AM, 100.3 at 11:00 AM and 100.1 at 12:15 PM. Pt drinking herbal teas from home. Now drinking water. Also O2 sats in high 80s, improve when using incentive spirometer. Pt only able to get spirometer to 1500 ml. Pt denies difficulty breathing, chest tightness or pain.  Interview conducted through family member serving as Equities trader.  O:  Filed Vitals:   06/30/12 0900 06/30/12 1020 06/30/12 1100 06/30/12 1216  BP: 115/77 113/72  109/71  Pulse: 72 91  92  Temp: 100.7 F (38.2 C) 100.3 F (37.9 C) 100.3 F (37.9 C) 100.1 F (37.8 C)  TempSrc:      Resp: 20 18 20 18   Height:      Weight:      SpO2: 98% 95% 91% 94%   GEN:  Awake, appropriately responsive, no acute distress. HEENT:  NCAT, EOMI, conjunctiva clear CV:  RRR, no murmur LUNGS:   CTAB, good air movement, no crackles or wheezes. ABD:  dresssing in place, clean/dry/intact. EXTREM:  No edema, SCDs on, no tenderness, no evidence of DVT.  A/P 41 y.o. [id number] s/p PLTCS at 7 am this morning with post-op fever. - AROM at 9:45 pm, multiple digital exams prior to c-section, no fever during labor - Pt reluctant/unable to push despite being completely dilated for 6 hours  -  Language barrier - difficult to truly assess pt symptoms - Will get CXR and treat presumptively for endometritis.  - O2 as needed, encourage incentive spirometry. Discussed with Dr. Penne Lash.  Napoleon Form, MD 06/30/2012 12:59 PM

## 2012-06-30 NOTE — Anesthesia Postprocedure Evaluation (Signed)
  Anesthesia Post-op Note  Patient: Brandy Suarez  Procedure(s) Performed: Procedure(s) (LRB) with comments: CESAREAN SECTION (N/A) - Primary cesarean section of baby  at [facility]  Patient Location: PACU and Mother/Baby  Anesthesia Type:Epidural  Level of Consciousness: awake, alert , oriented and patient cooperative  Airway and Oxygen Therapy: Patient Spontanous Breathing  Post-op Pain: none  Post-op Assessment: Post-op Vital signs reviewed and Patient's Cardiovascular Status Stable  Post-op Vital Signs: Reviewed and stable  Complications: No apparent anesthesia complications

## 2012-06-30 NOTE — Anesthesia Preprocedure Evaluation (Addendum)
Anesthesia Evaluation  Patient identified by MRN, date of birth, ID band Patient awake    Reviewed: Allergy & Precautions, H&P , Patient's Chart, lab work & pertinent test results  Airway Mallampati: III TM Distance: >3 FB Neck ROM: full    Dental No notable dental hx. (+) Teeth Intact   Pulmonary neg pulmonary ROS,  breath sounds clear to auscultation  Pulmonary exam normal       Cardiovascular negative cardio ROS  Rhythm:regular Rate:Normal     Neuro/Psych negative neurological ROS  negative psych ROS   GI/Hepatic negative GI ROS, Neg liver ROS,   Endo/Other  diabetes, Gestational  Renal/GU negative Renal ROS  negative genitourinary   Musculoskeletal   Abdominal Normal abdominal exam  (+)   Peds  Hematology negative hematology ROS (+)   Anesthesia Other Findings   Reproductive/Obstetrics (+) Pregnancy                           Anesthesia Physical Anesthesia Plan  ASA: II and emergent  Anesthesia Plan: Epidural   Post-op Pain Management:    Induction:   Airway Management Planned:   Additional Equipment:   Intra-op Plan:   Post-operative Plan:   Informed Consent: I have reviewed the patients History and Physical, chart, labs and discussed the procedure including the risks, benefits and alternatives for the proposed anesthesia with the patient or authorized representative who has indicated his/her understanding and acceptance.     Plan Discussed with: Anesthesiologist  Anesthesia Plan Comments:        Anesthesia Quick Evaluation

## 2012-06-30 NOTE — Op Note (Signed)
Brandy Suarez PROCEDURE DATE: 06/29/2012 - 06/30/2012  PREOPERATIVE DIAGNOSIS: Intrauterine pregnancy at  [redacted]w[redacted]d weeks gestation with arrest of descent and patient requesting cesarean section  POSTOPERATIVE DIAGNOSIS: The same  PROCEDURE:    Low Transverse Cesarean Section  SURGEON:  Dr. Elsie Lincoln  ASSISTANT: None  INDICATIONS: Brandy Suarez is a 41 y.o. [id number] at [redacted]w[redacted]d needing cesarean section secondary to arrest of descent and patient refusing to push.  The risks of cesarean section discussed with the patient included but were not limited to: bleeding which may require transfusion or reoperation; infection which may require antibiotics; injury to bowel, bladder, ureters or other surrounding organs; injury to the fetus; need for additional procedures including hysterectomy in the event of a life-threatening hemorrhage; placental abnormalities wth subsequent pregnancies, incisional problems, thromboembolic phenomenon and other postoperative/anesthesia complications. The patient concurred with the proposed plan, giving informed written consent for the procedure.    FINDINGS:  Viable female infant in cephalic presentation, nuchal cord x1, arterial cord pH=3.0 Thin meconium stained amniotic fluid.  Intact placenta, three vessel cord.  Grossly normal uterus, ovaries and fallopian tubes. .   ANESTHESIA:    Epidural ESTIMATED BLOOD LOSS: 800 ml SPECIMENS: Placenta sent to L&D COMPLICATIONS: None immediate  PROCEDURE IN DETAIL:  The patient received intravenous antibiotics and had sequential compression devices applied to her lower extremities.  Epidural anesthesia was dosed up to surgical level and was found to be adequate. She was then placed in a dorsal supine position with a leftward tilt, and prepped and draped in a sterile manner.  A foley catheter was in her bladder and attached to constant gravity.  Urine was noted to be pink prior to procedure.  After an adequate timeout was performed,  a Pfannenstiel skin incision was made with scalpel and carried through to the underlying layer of fascia. The fascia was incised in the midline and this incision was extended bilaterally using the Mayo scissors. Kocher clamps were applied to the superior aspect of the fascial incision and the underlying rectus muscles were dissected off bluntly. A similar process was carried out on the inferior aspect of the facial incision. The rectus muscles were separated in the midline bluntly and the peritoneum was entered bluntly.   A transverse hysterotomy was made with a scalpel and extended bilaterally bluntly. The bladder blade was then removed. The infant was successfully delivered, and cord was clamped and cut and infant was handed over to awaiting neonatology team. Uterine massage was then administered and the placenta delivered intact with three-vessel cord. The uterus was cleared of clot and debris.  The hysterotomy was closed with 0 vicryl.  A second imbricating suture of 0-Vicryl was used to reinforce the incision and aid in hemostasis.  The peritoneum and rectus muscles were noted to be hemostatic.  The fascia was closed with 0-Vicryl in a running fashion with good restoration of anatomy.  The subcutaneus tissue was copiously irrigated.  The skin was closed with staples.  Pt tolerated the procedure will.  All counts were correct x2.  Pt went to the recovery room in stable condition.

## 2012-06-30 NOTE — Anesthesia Procedure Notes (Signed)
Epidural Patient location during procedure: OB Start time: 06/30/2012 2:37 AM  Staffing Anesthesiologist: Darneshia Demary A. Performed by: anesthesiologist   Preanesthetic Checklist Completed: patient identified, site marked, surgical consent, pre-op evaluation, timeout performed, IV checked, risks and benefits discussed and monitors and equipment checked  Epidural Patient position: sitting Prep: site prepped and draped and DuraPrep Patient monitoring: continuous pulse ox and blood pressure Approach: midline Injection technique: LOR air  Needle:  Needle type: Tuohy  Needle gauge: 17 G Needle length: 9 cm and 9 Needle insertion depth: 4 cm Catheter type: closed end flexible Catheter size: 19 Gauge Catheter at skin depth: 10 cm Test dose: negative and Other  Assessment Events: blood not aspirated, injection not painful, no injection resistance, negative IV test and no paresthesia  Additional Notes Patient identified. Risks and benefits discussed including failed block, incomplete  Pain control, post dural puncture headache, nerve damage, paralysis, blood pressure Changes, nausea, vomiting, reactions to medications-both toxic and allergic and post Partum back pain. All questions were answered. Patient expressed understanding and wished to proceed. Sterile technique was used throughout procedure. Epidural site was Dressed with sterile barrier dressing. No paresthesias, signs of intravascular injection Or signs of intrathecal spread were encountered.  Patient was more comfortable after the epidural was dosed. [place] interpreter used for procedure. Please see RN's note for documentation of vital signs and FHR which are stable.

## 2012-06-30 NOTE — Progress Notes (Signed)
Brandy Suarez is a 41 y.o. G3P1011 at [redacted]w[redacted]d in labor Subjective: Pt exhausted.  Will not push anymore.  Would like a cesarean section.  Objective: BP 116/68  Pulse 118  Temp 98.8 F (37.1 C) (Oral)  Resp 18  Ht 4\' 11"  (1.499 m)  Wt 128 lb 12.8 oz (58.423 kg)  BMI 26.01 kg/m2  SpO2 98%  LMP 10/01/2011   Total I/O In: -  Out: 25 [Urine:25]  FHT:  FHR: 145 bpm, variability: moderate,  accelerations:  Present,  decelerations:  Present variables UC:   regular, every 3 minutes SVE:   Dilation: 10 Effacement (%):  (puffy) Station: +2 Exam by:: Leftwich-Kirby, CNM  Labs: Lab Results  Component Value Date   WBC 12.0* 06/29/2012   HGB 12.4 06/29/2012   HCT 37.8 06/29/2012   MCV 92.6 06/29/2012   PLT 274 06/29/2012    Assessment / Plan: Arrest of decent  Pt brought family member to interpret and signed waiver for hospital interpreter.  The risks of cesarean section discussed with the patient included but were not limited to: bleeding which may require transfusion or reoperation; infection which may require antibiotics; injury to bowel, bladder, ureters or other surrounding organs; injury to the fetus; need for additional procedures including hysterectomy in the event of a life-threatening hemorrhage; placental abnormalities wth subsequent pregnancies, incisional problems, thromboembolic phenomenon and other postoperative/anesthesia complications. The patient concurred with the proposed plan, giving informed written consent for the procedure.   OR notified.    Labor: protracted 2nd stage Preeclampsia:  no signs or symptoms of toxicity Fetal Wellbeing:  Category II Pain Control:  Epidural I/D:  ancef prior to OR Anticipated MOD:  c/s  Brandy Suarez H. 06/30/2012, 6:17 AM

## 2012-06-30 NOTE — Transfer of Care (Signed)
Immediate Anesthesia Transfer of Care Note  Patient: Brandy Suarez  Procedure(s) Performed: Procedure(s) (LRB) with comments: CESAREAN SECTION (N/A) - Primary cesarean section of baby  at [facility]GAR  Patient Location: PACU  Anesthesia Type:Epidural  Level of Consciousness: awake, alert  and oriented  Airway & Oxygen Therapy: Patient Spontanous Breathing  Post-op Assessment: Report given to PACU RN  Post vital signs: Reviewed and stable  Complications: No apparent anesthesia complications

## 2012-06-30 NOTE — Progress Notes (Signed)
Brandy Suarez is a 41 y.o. G3P1011 at [redacted]w[redacted]d admitted for active labor  Subjective: Pt comfortable with epidural.  Family at bedside for support.  Objective: BP 116/68  Pulse 118  Temp 98.8 F (37.1 C) (Oral)  Resp 18  Ht 4\' 11"  (1.499 m)  Wt 58.423 kg (128 lb 12.8 oz)  BMI 26.01 kg/m2  SpO2 98%  LMP 10/01/2011   Total I/O In: -  Out: 25 [Urine:25]  FHT:  FHR: 145 bpm, variability: moderate,  accelerations:  Abscent,  decelerations:  Present variables, period of lates with pushing, resolved UC:   regular, every 3 minutes  Pushing effort is moderate, pt encouraged for optimum positioning/pushing effort  CBG 160 and in one hour 144--started Glucostabilizer protocol with IV insulin  Labs: Lab Results  Component Value Date   WBC 12.0* 06/29/2012   HGB 12.4 06/29/2012   HCT 37.8 06/29/2012   MCV 92.6 06/29/2012   PLT 274 06/29/2012    Assessment / Plan: Augmentation of labor, progressing well Discussed plan of care with Dr Erin Fulling, plan to continue pushing efforts at this time  Labor: Progressing normally  Fetal Wellbeing:  Category II Pain Control:  Epidural  Anticipated MOD:  NSVD  Brandy Suarez, Brandy Suarez 06/30/2012, 6:02 AM

## 2012-06-30 NOTE — Anesthesia Postprocedure Evaluation (Signed)
  Anesthesia Post-op Note  Patient: Brandy Suarez  Procedure(s) Performed: Procedure(s) (LRB) with comments: CESAREAN SECTION (N/A) - Primary cesarean section of baby  at APGAR  Patient Location: PACU  Anesthesia Type:Epidural  Level of Consciousness: awake, alert  and oriented  Airway and Oxygen Therapy: Patient Spontanous Breathing  Post-op Pain: none  Post-op Assessment: Post-op Vital signs reviewed  Post-op Vital Signs: Reviewed and stable  Complications: No apparent anesthesia complications

## 2012-06-30 NOTE — Progress Notes (Signed)
Brandy Suarez is a 41 y.o. G3P1011 at [redacted]w[redacted]d admitted for active labor  Subjective: Pt exhausted, feeling urge to push with contractions.  Objective: BP 130/68  Pulse 91  Temp 98.3 F (36.8 C) (Oral)  Resp 16  Ht 4\' 11"  (1.499 m)  Wt 58.423 kg (128 lb 12.8 oz)  BMI 26.01 kg/m2  LMP 10/01/2011   Total I/O In: -  Out: 25 [Urine:25]  FHT:  FHR: 135 bpm, variability: moderate,  accelerations:  Abscent,  decelerations:  Present variables with pushing UC:   regular, every 3 minutes SVE:   Dilation: 10 Effacement (%):  (puffy) Station: +2 Exam by:: Leftwich-Kirby, CNM  Compete dilation at 0118 Pushed x30 minutes with pt.   Pt unable to push effectively with assistance by RN, CNM, and family members due to maternal exhaustion. Discussed epidural with pt as option to allow rest.  Labs: Lab Results  Component Value Date   WBC 12.0* 06/29/2012   HGB 12.4 06/29/2012   HCT 37.8 06/29/2012   MCV 92.6 06/29/2012   PLT 274 06/29/2012    Assessment / Plan: Augmentation of labor, progressing well Maternal exhaustion  Plan for pt to get epidural, rest x1 hour, resume pushing.   Labor: Progressing normally  Fetal Wellbeing:  Category I Pain Control:  Fentanyl  Anticipated MOD:  NSVD  LEFTWICH-KIRBY, Alleen Kehm 06/30/2012, 1:52 AM

## 2012-07-01 ENCOUNTER — Encounter (HOSPITAL_COMMUNITY): Payer: Self-pay | Admitting: Obstetrics & Gynecology

## 2012-07-01 LAB — CBC
HCT: 31.8 % — ABNORMAL LOW (ref 36.0–46.0)
Hemoglobin: 10.6 g/dL — ABNORMAL LOW (ref 12.0–15.0)
WBC: 17.2 10*3/uL — ABNORMAL HIGH (ref 4.0–10.5)

## 2012-07-01 LAB — GLUCOSE, CAPILLARY: Glucose-Capillary: 144 mg/dL — ABNORMAL HIGH (ref 70–99)

## 2012-07-01 NOTE — Progress Notes (Signed)
UR completed 

## 2012-07-01 NOTE — Progress Notes (Signed)
Subjective: Postpartum Day 1: Cesarean Delivery S/O at bedside, aids with interpretation. Patient reports incisional pain, tolerating PO and + flatus. Voiding better. Breathing has been better. Belly still feels "large."  Objective: Vital signs in last 24 hours: Temp:  [97.8 F (36.6 C)-100.7 F (38.2 C)] 97.9 F (36.6 C) (12/10 0630) Pulse Rate:  [72-99] 75  (12/10 0630) Resp:  [13-21] 18  (12/10 0630) BP: (92-125)/(53-77) 92/58 mmHg (12/10 0630) SpO2:  [88 %-98 %] 95 % (12/10 0630)  Physical Exam:  General: alert, cooperative and no distress, appears tired Lochia: appropriate Uterine Fundus: firm Incision: dressing in place, no gross bleeding/drainage DVT Evaluation: No evidence of DVT seen on physical exam.   Basename 07/01/12 0520 06/29/12 1435  HGB 10.6* 12.4  HCT 31.8* 37.8    Assessment/Plan: -Status post Cesarean section. Doing well postoperatively.  -Post-op temps 12/9 (100.7 > 100.3 > 100.1).  -Afebrile since temp of 100.1 12/9 at 1216 (tmax since then 99.3) -O2 sats in high 80's on 12/9  -also improved (sats 93-96 during exam/interview above)  -sats improved throughout the day yesterday per flowsheet documentation  Plan: Continue current care. Plan discharge tomorrow or Wednesday. Incentive spirometry, encourage ambulation. Breast/bottle feeds. Undecided on contraception, considering OCP's. Outpt circ.  Street, Christopher 07/01/2012, 7:33 AM

## 2012-07-01 NOTE — Progress Notes (Signed)
I have seen and examined patient and agree with above. Encouraged ambulation. Antibiotics scheduled to end at 24 hours. Afebrile. O2 sats improved - no O2 requirement. Continue incentive spirometry. Napoleon Form, MD

## 2012-07-01 NOTE — Anesthesia Postprocedure Evaluation (Signed)
  Anesthesia Post-op Note  Patient: Brandy Suarez  Procedure(s) Performed: Procedure(s) (LRB) with comments: CESAREAN SECTION (N/A) - Primary cesarean section of baby  at APGAR  Patient Location: Mother/Baby  Anesthesia Type:Epidural  Level of Consciousness: awake  Airway and Oxygen Therapy: Patient Spontanous Breathing  Post-op Pain: none  Post-op Assessment: Patient's Cardiovascular Status Stable, Respiratory Function Stable, Patent Airway, No signs of Nausea or vomiting, Adequate PO intake, Pain level controlled, No headache, No backache, No residual numbness and No residual motor weakness  Post-op Vital Signs: Reviewed and stable  Complications: No apparent anesthesia complications

## 2012-07-01 NOTE — Addendum Note (Signed)
Addendum  created 07/01/12 1023 by Suella Grove, CRNA   Modules edited:Charges VN, Notes Section

## 2012-07-02 DIAGNOSIS — Z98891 History of uterine scar from previous surgery: Secondary | ICD-10-CM

## 2012-07-02 NOTE — Progress Notes (Signed)
Attestation of Attending Supervision of Resident: Evaluation and management procedures were performed by the Family Medicine Resident under my supervision.  I have seen and examined the patient, reviewed the resident's note and chart, and I agree with the management and plan.  Angeles Paolucci, MD, FACOG Attending Obstetrician & Gynecologist Faculty Practice, [facility] 

## 2012-07-02 NOTE — Progress Notes (Signed)
Subjective: Postpartum Day 2: Cesarean Delivery S/O at bedside, aids with interpretation. Patient reports tolerating PO, + flatus and no problems voiding. States she feels a lot better than yesterday. No SOB.  Objective: Vital signs in last 24 hours: Temp:  [97.4 F (36.3 C)-98.2 F (36.8 C)] 98.2 F (36.8 C) (12/11 0615) Pulse Rate:  [66-77] 66  (12/11 0615) Resp:  [18] 18  (12/11 0615) BP: (95-99)/(57-63) 95/57 mmHg (12/11 0615)  Physical Exam:  General: alert, cooperative and no distress Lochia: appropriate Uterine Fundus: firm Incision: dressing in place, no gross bleeding/drainage DVT Evaluation: No evidence of DVT seen on physical exam.   Basename 07/01/12 0520 06/29/12 1435  HGB 10.6* 12.4  HCT 31.8* 37.8    Assessment/Plan: -Status post Cesarean section. Doing well postoperatively.  -Remains afebrile. No oxygen requirement.  Plan: Continue current care. Plan discharge tomorrow. Incentive spirometry, encourage ambulation. Breast/bottle feeds. Undecided on contraception, considering OCP's. Outpt circ.  Brandy Suarez 07/02/2012, 7:28 AM

## 2012-07-03 MED ORDER — OXYCODONE-ACETAMINOPHEN 5-325 MG PO TABS: 1.0000 | ORAL_TABLET | ORAL | Status: DC | PRN

## 2012-07-03 NOTE — Discharge Summary (Signed)
Obstetric Discharge Summary Reason for Admission: induction of labor Prenatal Procedures: none INDICATIONS: Brandy Suarez is a 41 y.o. [id number] at [redacted]w[redacted]d needing cesarean section secondary to arrest of descent and patient refusing to push. The risks of cesarean section discussed with the patient included but were not limited to: bleeding which may require transfusion or reoperation; infection which may require antibiotics; injury to bowel, bladder, ureters or other surrounding organs; injury to the fetus; need for additional procedures including hysterectomy in the event of a life-threatening hemorrhage; placental abnormalities wth subsequent pregnancies, incisional problems, thromboembolic phenomenon and other postoperative/anesthesia complications. The patient concurred with the proposed plan, giving informed written consent for the procedure.     Intrapartum Procedures: cesarean: low cervical, transverse Postpartum Procedures: none Complications-Operative and Postpartum: none Hemoglobin  Date Value Range Status  07/01/2012 10.6* 12.0 - 15.0 g/dL Final     HCT  Date Value Range Status  07/01/2012 31.8* 36.0 - 46.0 % Final    Physical Exam:  General: alert, cooperative and no distress Lochia: appropriate Uterine Fundus: firm Incision: healing well DVT Evaluation: No evidence of DVT seen on physical exam.  Discharge Diagnoses: Term Pregnancy-delivered  Discharge Information: Date: 07/03/2012 Activity: unrestricted Diet: routine Medications: Ibuprofen and Percocet Condition: stable Instructions: refer to practice specific booklet Discharge to: home Follow-up Information    Follow up with [facility]. Call in 6 weeks.   Contact information:   [address] [place] [address] [phone]         Newborn Data: Live born female  Birth Weight: 6 lb 14.8 oz (3140 g) APGAR: 8, 9  Home with mother.  Tawnya Crook 07/03/2012, 7:47 AM

## 2012-07-04 ENCOUNTER — Encounter: Payer: Medicaid Other | Admitting: Family Medicine

## 2012-07-07 ENCOUNTER — Telehealth (HOSPITAL_COMMUNITY): Payer: Self-pay | Admitting: *Deleted

## 2012-07-07 NOTE — Telephone Encounter (Signed)
Resolve episode 

## 2012-07-07 NOTE — H&P (Signed)
Agree with above. Advith Martine L. Harraway-Smith, M.D., Evern Core

## 2012-08-15 ENCOUNTER — Ambulatory Visit (INDEPENDENT_AMBULATORY_CARE_PROVIDER_SITE_OTHER): Payer: Medicaid Other | Admitting: Family Medicine

## 2012-08-15 ENCOUNTER — Encounter: Payer: Self-pay | Admitting: Family Medicine

## 2012-08-15 VITALS — BP 113/73 | HR 75 | Wt 109.0 lb

## 2012-08-15 DIAGNOSIS — Z8632 Personal history of gestational diabetes: Secondary | ICD-10-CM

## 2012-08-15 DIAGNOSIS — Z3040 Encounter for surveillance of contraceptives, unspecified: Secondary | ICD-10-CM

## 2012-08-15 MED ORDER — NORETHINDRONE 0.35 MG PO TABS: 1.0000 | ORAL_TABLET | Freq: Every day | ORAL | Status: AC

## 2012-08-15 NOTE — Progress Notes (Signed)
  Subjective:     Brandy Suarez is a 42 y.o. female who presents for a postpartum visit. She is 6 weeks postpartum following a low cervical transverse Cesarean section. I have fully reviewed the prenatal and intrapartum course. The delivery was at 39 gestational weeks. Outcome: primary cesarean section, low transverse incision. Anesthesia: epidural. Postpartum course has been normal. Baby's course has been unremarkable. Baby is feeding by breast. Bleeding staining only. Bowel function is normal. Bladder function is normal. Patient is not sexually active. Contraception method is none. Postpartum depression screening: negative.  The following portions of the patient's history were reviewed and updated as appropriate: allergies, current medications, past family history, past medical history, past social history, past surgical history and problem list.  Review of Systems A comprehensive review of systems was negative.   Objective:    BP 113/73  Pulse 75  Wt 109 lb (49.442 kg)  Breastfeeding? Yes  General:  alert, cooperative and appears stated age        Incision Well healed  Abdomen: soft, non-tender; bowel sounds normal; no masses,  no organomegaly   Vulva:  normal  Vagina: normal vagina  Cervix:  multiparous appearance and no cervical motion tenderness  Corpus: normal size, contour, position, consistency, mobility, non-tender  Adnexa:  normal adnexa           Assessment:    Nml postpartum exam. Pap smear not done at today's visit.   Plan:    1. Contraception: oral progesterone-only contraceptive 2. Pap due 03/2013.

## 2012-08-15 NOTE — Patient Instructions (Signed)
  Place postpartum visit patient instructions here.  

## 2012-08-21 ENCOUNTER — Other Ambulatory Visit (INDEPENDENT_AMBULATORY_CARE_PROVIDER_SITE_OTHER): Payer: Medicaid Other | Admitting: *Deleted

## 2012-08-21 DIAGNOSIS — Z8632 Personal history of gestational diabetes: Secondary | ICD-10-CM

## 2012-08-21 DIAGNOSIS — O99815 Abnormal glucose complicating the puerperium: Secondary | ICD-10-CM

## 2012-08-21 DIAGNOSIS — O24419 Gestational diabetes mellitus in pregnancy, unspecified control: Secondary | ICD-10-CM

## 2012-08-26 LAB — GLUCOSE TOLERANCE, 2 HOURS W/ 1HR
Glucose, 2 hour: 118 mg/dL (ref 70–139)
Glucose, Fasting: 83 mg/dL (ref 70–99)

## 2013-03-17 ENCOUNTER — Encounter: Payer: Self-pay | Admitting: Family Medicine

## 2013-03-17 ENCOUNTER — Ambulatory Visit (INDEPENDENT_AMBULATORY_CARE_PROVIDER_SITE_OTHER): Payer: Medicaid Other | Admitting: Family Medicine

## 2013-03-17 VITALS — BP 99/71 | HR 71 | Ht 62.0 in | Wt 109.0 lb

## 2013-03-17 DIAGNOSIS — R1011 Right upper quadrant pain: Secondary | ICD-10-CM | POA: Insufficient documentation

## 2013-03-17 MED ORDER — DICLOFENAC SODIUM 75 MG PO TBEC: 75.0000 mg | DELAYED_RELEASE_TABLET | Freq: Two times a day (BID) | ORAL | Status: AC

## 2013-03-17 NOTE — Patient Instructions (Signed)
Vim S?n S??n (Costochondritis) B?n ? ???c bc s? ch?n ?on l b? tch s?n s??n. Vim s?n s??n (H?i ch?ng Tietze), ho?c ch? n?i gi?a s?n v x??ng l hi?n t??ng s?ng t?y c?a m (s?n) n?i x??ng s??n v?i x??ng ng?c (x??ng ?c). B?nh c th? t? pht sinh (t? pht), do ch?n th??ng, ho?c ??n gi?n l do ho ho?c t?p th? d?c nh?Marland Kitchen B?nh c th? ph?i m?t h?n 6 tu?n m?i ?? v c th? lu h?n n?u b?n khng th? duy tr sinh ho?t c?a mnh. H??NG D?N CH?M Blackburn T?I NH  Trnh ho?t ??ng c? th? lm ki?t s?c. C? g?ng khng c?ng s??n trong sinh ho?t bnh th??ng. ? l b?t k? ho?t ??ng no c s? d?ng ng?c, b?ng v cc c? hai bn s??n, ??c bi?t n?u nng v?t n?ng.  Ch??m ? trong 15 ??n 20 pht m?i gi? khi ?ang th?c trong 2 ngy ??u tin. ?? ? l?nh trong ti ny-lon, v ??t m?t chi?c kh?n gi?a ti ? v da c?a b?n.  Ch? dng cc thu?c ???c bn khng c?n ??n thu?c c?a Bc s? ho?c theo toa c?a Bc s? ?? gi?m ?au, kh ch?u, hay s?t theo nh? h??ng d?n c?a Bc s?. HY ??N KHM B?NH NGAY L?P T?C N?U:  B?n ngy cng ?au v th?y r?t kh ch?u.  B?n b? s?t.  Ho ra mu.  Xu?t hi?n ?au ng?c d? d?i, th? ng?n, ?? m? hi, hay i.  Nh?ng c?n ?au th?t ng?c c?a b?n ngy cng tr?m tr?ng thm.  B?n c cc tri?u ch?ng m?i v khng r nguyn nhn. HY CH?C CH?N R?NG B?N:  Hi?u r nh?ng h??ng d?n khi xu?t vi?n.  S? theo di tnh tr?ng b?nh c?a b?n.  S? ??n khm b?nh ngay l?p t?c nh? ? ???c h??ng d?n. Document Released: 04/18/2005 Document Revised: 10/01/2011 [facility] Patient Information 2014 [place], [place].

## 2013-03-17 NOTE — Assessment & Plan Note (Signed)
?   Costochondritis--trial of NSAIDS.  PCP referral.

## 2013-03-17 NOTE — Progress Notes (Signed)
  Subjective:    Patient ID: Brandy Suarez, female    DOB: [birth year], 42 y.o.   MRN: [id number]  Abdominal Pain Pertinent negatives include no diarrhea, dysuria, fever, nausea or vomiting.    Reports 4 d h/o RUQ pain.  Worse with movement and palpation.  No correlation with food.  No association with nausea, fever, emesis.  Notes some SOB.  On POP.  No hemoptysis.  Worse with deep inspiration.  Review of Systems  Constitutional: Negative for fever and chills.  Respiratory: Negative for shortness of breath.   Cardiovascular: Negative for chest pain.  Gastrointestinal: Positive for abdominal pain. Negative for nausea, vomiting and diarrhea.  Genitourinary: Negative for dysuria.       Objective:   Physical Exam  Vitals reviewed. Constitutional: She appears well-developed and well-nourished. No distress.  HENT:  Head: Normocephalic and atraumatic.  Eyes: No scleral icterus.  Neck: Neck supple.  Cardiovascular: Normal rate and regular rhythm.   Pulmonary/Chest: Effort normal and breath sounds normal. No respiratory distress. She exhibits tenderness (along costal margin of right rib cage--exquisitely tender to palpation.  No tenderness at Murphy's sign.).  Abdominal: Soft. Bowel sounds are normal. She exhibits no mass. There is no tenderness. There is no rebound and no guarding.          Assessment & Plan:

## 2014-05-24 ENCOUNTER — Encounter: Payer: Self-pay | Admitting: Family Medicine

## 2019-10-29 ENCOUNTER — Ambulatory Visit: Payer: Self-pay | Attending: Internal Medicine

## 2019-10-29 DIAGNOSIS — Z23 Encounter for immunization: Secondary | ICD-10-CM

## 2019-10-29 NOTE — Progress Notes (Signed)
   Covid-19 Vaccination Clinic  Name:  Ailynn Gow    MRN: [id number] DOB: [birth year]  10/29/2019  Ms. Kretz was observed post Covid-19 immunization for 15 minutes without incident. She was provided with Vaccine Information Sheet and instruction to access the V-Safe system.   Ms. Newhall was instructed to call 911 with any severe reactions post vaccine: Marland Kitchen Difficulty breathing  . Swelling of face and throat  . A fast heartbeat  . A bad rash all over body  . Dizziness and weakness   Immunizations Administered    Name Date Dose VIS Date Route   Pfizer COVID-19 Vaccine 10/29/2019  9:11 AM 0.3 mL 07/03/2019 Intramuscular   Manufacturer: [employer], [employer]   Lot: [id number]   NDC: [address]-3

## 2019-11-23 ENCOUNTER — Ambulatory Visit: Payer: Self-pay | Attending: Internal Medicine

## 2019-11-23 DIAGNOSIS — Z23 Encounter for immunization: Secondary | ICD-10-CM

## 2019-11-23 NOTE — Progress Notes (Signed)
   Covid-19 Vaccination Clinic  Name:  Sully Manzi    MRN: [id number] DOB: 02/08/1971  11/23/2019  Ms. Pack was observed post Covid-19 immunization for 15 minutes without incident. She was provided with Vaccine Information Sheet and instruction to access the V-Safe system.   Ms. Leyland was instructed to call 911 with any severe reactions post vaccine: Marland Kitchen Difficulty breathing  . Swelling of face and throat  . A fast heartbeat  . A bad rash all over body  . Dizziness and weakness   Immunizations Administered    Name Date Dose VIS Date Route   Pfizer COVID-19 Vaccine 11/23/2019  8:46 AM 0.3 mL 09/16/2018 Intramuscular   Manufacturer: [employer], [employer]   Lot: [id number]   NDC: [address]-3

## 2020-11-14 DIAGNOSIS — J329 Chronic sinusitis, unspecified: Secondary | ICD-10-CM | POA: Diagnosis not present

## 2020-12-26 DIAGNOSIS — Z1211 Encounter for screening for malignant neoplasm of colon: Secondary | ICD-10-CM | POA: Diagnosis not present

## 2020-12-26 DIAGNOSIS — Z0000 Encounter for general adult medical examination without abnormal findings: Secondary | ICD-10-CM | POA: Diagnosis not present

## 2020-12-26 DIAGNOSIS — Z8632 Personal history of gestational diabetes: Secondary | ICD-10-CM | POA: Diagnosis not present

## 2021-10-05 DIAGNOSIS — Z9109 Other allergy status, other than to drugs and biological substances: Secondary | ICD-10-CM | POA: Diagnosis not present

## 2021-10-05 DIAGNOSIS — J301 Allergic rhinitis due to pollen: Secondary | ICD-10-CM | POA: Diagnosis not present

## 2021-10-05 DIAGNOSIS — J029 Acute pharyngitis, unspecified: Secondary | ICD-10-CM | POA: Diagnosis not present

## 2021-10-05 DIAGNOSIS — J069 Acute upper respiratory infection, unspecified: Secondary | ICD-10-CM | POA: Diagnosis not present

## 2021-11-05 ENCOUNTER — Emergency Department (HOSPITAL_COMMUNITY): Payer: Medicaid Other

## 2021-11-05 ENCOUNTER — Encounter (HOSPITAL_COMMUNITY): Payer: Self-pay | Admitting: Emergency Medicine

## 2021-11-05 ENCOUNTER — Emergency Department (HOSPITAL_COMMUNITY)
Admission: EM | Admit: 2021-11-05 | Discharge: 2021-11-06 | Disposition: A | Discharge status: Home / Self Care | Payer: Medicaid Other | Attending: Emergency Medicine | Admitting: Emergency Medicine

## 2021-11-05 DIAGNOSIS — R109 Unspecified abdominal pain: Secondary | ICD-10-CM | POA: Diagnosis not present

## 2021-11-05 DIAGNOSIS — R0981 Nasal congestion: Secondary | ICD-10-CM | POA: Diagnosis present

## 2021-11-05 DIAGNOSIS — Z20822 Contact with and (suspected) exposure to covid-19: Secondary | ICD-10-CM | POA: Diagnosis not present

## 2021-11-05 DIAGNOSIS — R0789 Other chest pain: Secondary | ICD-10-CM | POA: Diagnosis not present

## 2021-11-05 DIAGNOSIS — M47812 Spondylosis without myelopathy or radiculopathy, cervical region: Secondary | ICD-10-CM | POA: Diagnosis not present

## 2021-11-05 DIAGNOSIS — J341 Cyst and mucocele of nose and nasal sinus: Secondary | ICD-10-CM | POA: Diagnosis not present

## 2021-11-05 DIAGNOSIS — J039 Acute tonsillitis, unspecified: Secondary | ICD-10-CM | POA: Insufficient documentation

## 2021-11-05 DIAGNOSIS — R079 Chest pain, unspecified: Secondary | ICD-10-CM | POA: Diagnosis not present

## 2021-11-05 DIAGNOSIS — J019 Acute sinusitis, unspecified: Secondary | ICD-10-CM | POA: Diagnosis not present

## 2021-11-05 DIAGNOSIS — R040 Epistaxis: Secondary | ICD-10-CM | POA: Diagnosis not present

## 2021-11-05 DIAGNOSIS — R911 Solitary pulmonary nodule: Secondary | ICD-10-CM | POA: Diagnosis not present

## 2021-11-05 DIAGNOSIS — M5020 Other cervical disc displacement, unspecified cervical region: Secondary | ICD-10-CM | POA: Diagnosis not present

## 2021-11-05 LAB — URINALYSIS, ROUTINE W REFLEX MICROSCOPIC
Bacteria, UA: NONE SEEN
Bilirubin Urine: NEGATIVE
Glucose, UA: NEGATIVE mg/dL
Ketones, ur: NEGATIVE mg/dL
Leukocytes,Ua: NEGATIVE
Nitrite: NEGATIVE
Protein, ur: NEGATIVE mg/dL
Specific Gravity, Urine: 1.003 — ABNORMAL LOW (ref 1.005–1.030)
pH: 7 (ref 5.0–8.0)

## 2021-11-05 LAB — TROPONIN I (HIGH SENSITIVITY)
Troponin I (High Sensitivity): <2 ng/L (ref <18)
Troponin I (High Sensitivity): 3 ng/L (ref <18)

## 2021-11-05 LAB — COMPREHENSIVE METABOLIC PANEL
ALT: 21 U/L (ref 0–44)
AST: 26 U/L (ref 15–41)
Albumin: 3.4 g/dL — ABNORMAL LOW (ref 3.5–5.0)
Alkaline Phosphatase: 42 U/L (ref 38–126)
Anion gap: 10 (ref 5–15)
BUN: 6 mg/dL (ref 6–20)
CO2: 22 mmol/L (ref 22–32)
Calcium: 8.6 mg/dL — ABNORMAL LOW (ref 8.9–10.3)
Chloride: 103 mmol/L (ref 98–111)
Creatinine, Ser: 0.66 mg/dL (ref 0.44–1.00)
GFR, Estimated: >60 mL/min (ref >60)
Glucose, Bld: 194 mg/dL — ABNORMAL HIGH (ref 70–99)
Potassium: 3.6 mmol/L (ref 3.5–5.1)
Sodium: 135 mmol/L (ref 135–145)
Total Bilirubin: 0.5 mg/dL (ref 0.3–1.2)
Total Protein: 7.7 g/dL (ref 6.5–8.1)

## 2021-11-05 LAB — CBC WITH DIFFERENTIAL/PLATELET
Abs Immature Granulocytes: 0.08 10*3/uL — ABNORMAL HIGH (ref 0.00–0.07)
Basophils Absolute: 0 10*3/uL (ref 0.0–0.1)
Basophils Relative: 0 %
Eosinophils Absolute: 0.2 10*3/uL (ref 0.0–0.5)
Eosinophils Relative: 1 %
HCT: 35.2 % — ABNORMAL LOW (ref 36.0–46.0)
Hemoglobin: 11.4 g/dL — ABNORMAL LOW (ref 12.0–15.0)
Immature Granulocytes: 1 %
Lymphocytes Relative: 9 %
Lymphs Abs: 1.6 10*3/uL (ref 0.7–4.0)
MCH: 30.6 pg (ref 26.0–34.0)
MCHC: 32.4 g/dL (ref 30.0–36.0)
MCV: 94.6 fL (ref 80.0–100.0)
Monocytes Absolute: 0.7 10*3/uL (ref 0.1–1.0)
Monocytes Relative: 4 %
Neutro Abs: 14.6 10*3/uL — ABNORMAL HIGH (ref 1.7–7.7)
Neutrophils Relative %: 85 %
Platelets: 336 10*3/uL (ref 150–400)
RBC: 3.72 MIL/uL — ABNORMAL LOW (ref 3.87–5.11)
RDW: 12.8 % (ref 11.5–15.5)
WBC: 17.2 10*3/uL — ABNORMAL HIGH (ref 4.0–10.5)
nRBC: 0 % (ref 0.0–0.2)

## 2021-11-05 LAB — LIPASE, BLOOD: Lipase: 34 U/L (ref 11–51)

## 2021-11-05 MED ORDER — ACETAMINOPHEN 325 MG PO TABS
650.0000 mg | ORAL_TABLET | Freq: Once | ORAL | Status: AC | PRN
Start: 2021-11-05 — End: 2021-11-05
  Administered 2021-11-05: 650 mg via ORAL
  Filled 2021-11-05: qty 2

## 2021-11-05 NOTE — ED Triage Notes (Signed)
Patient here with complaint of intermittent chest pain, abdominal pain, and epistaxis that started a few weeks ago but got worse over the past week. Patient denies any medical history, only takes a medication for seasonal allergies. Temperature 101.74F in triage, patient also reports sore throat. Patient is alert, oriented, ambulatory, and in no apparent distress at this time. ?

## 2021-11-06 ENCOUNTER — Encounter (HOSPITAL_COMMUNITY): Payer: Self-pay

## 2021-11-06 ENCOUNTER — Emergency Department (HOSPITAL_COMMUNITY): Payer: Medicaid Other

## 2021-11-06 ENCOUNTER — Other Ambulatory Visit: Payer: Self-pay

## 2021-11-06 DIAGNOSIS — M5020 Other cervical disc displacement, unspecified cervical region: Secondary | ICD-10-CM | POA: Diagnosis not present

## 2021-11-06 DIAGNOSIS — R079 Chest pain, unspecified: Secondary | ICD-10-CM | POA: Diagnosis not present

## 2021-11-06 DIAGNOSIS — J341 Cyst and mucocele of nose and nasal sinus: Secondary | ICD-10-CM | POA: Diagnosis not present

## 2021-11-06 DIAGNOSIS — R911 Solitary pulmonary nodule: Secondary | ICD-10-CM | POA: Diagnosis not present

## 2021-11-06 DIAGNOSIS — R040 Epistaxis: Secondary | ICD-10-CM | POA: Diagnosis not present

## 2021-11-06 DIAGNOSIS — R109 Unspecified abdominal pain: Secondary | ICD-10-CM | POA: Diagnosis not present

## 2021-11-06 DIAGNOSIS — M47812 Spondylosis without myelopathy or radiculopathy, cervical region: Secondary | ICD-10-CM | POA: Diagnosis not present

## 2021-11-06 LAB — RESP PANEL BY RT-PCR (FLU A&B, COVID) ARPGX2
Influenza A by PCR: NEGATIVE
Influenza B by PCR: NEGATIVE
SARS Coronavirus 2 by RT PCR: NEGATIVE

## 2021-11-06 LAB — I-STAT BETA HCG BLOOD, ED (MC, WL, AP ONLY): I-stat hCG, quantitative: <5 m[IU]/mL (ref <5)

## 2021-11-06 MED ORDER — IBUPROFEN 600 MG PO TABS: 600.0000 mg | ORAL_TABLET | Freq: Four times a day (QID) | ORAL | 0 refills | Status: AC | PRN

## 2021-11-06 MED ORDER — AMOXICILLIN-POT CLAVULANATE 875-125 MG PO TABS: 1.0000 | ORAL_TABLET | Freq: Two times a day (BID) | ORAL | 0 refills | Status: AC

## 2021-11-06 MED ORDER — ACETAMINOPHEN 325 MG PO TABS
650.0000 mg | ORAL_TABLET | Freq: Once | ORAL | Status: AC
  Administered 2021-11-06: 650 mg via ORAL
  Filled 2021-11-06: qty 2

## 2021-11-06 MED ORDER — IBUPROFEN 400 MG PO TABS
600.0000 mg | ORAL_TABLET | Freq: Once | ORAL | Status: AC
  Administered 2021-11-06: 600 mg via ORAL
  Filled 2021-11-06: qty 1

## 2021-11-06 MED ORDER — ACETAMINOPHEN 325 MG PO TABS: 650.0000 mg | ORAL_TABLET | Freq: Four times a day (QID) | ORAL | 0 refills | Status: AC | PRN

## 2021-11-06 MED ORDER — LACTATED RINGERS IV BOLUS
1000.0000 mL | Freq: Once | INTRAVENOUS | Status: AC
  Administered 2021-11-06: 1000 mL via INTRAVENOUS

## 2021-11-06 MED ORDER — IOHEXOL 350 MG/ML SOLN
100.0000 mL | Freq: Once | INTRAVENOUS | Status: AC | PRN
  Administered 2021-11-06: 100 mL via INTRAVENOUS

## 2021-11-06 NOTE — ED Provider Notes (Signed)
?[facility] EMERGENCY DEPARTMENT ?Provider Note ? ? ?CSN: [id number] ?Arrival date & time: 11/05/21  1736 ? ?  ? ?History ? ?Chief Complaint  ?Patient presents with  ? Chest Pain  ? ? ?Brandy Suarez is a 51 y.o. female. ? ?HPI ?Patient presents for multiple complaints.  She has had 2 weeks of ongoing nasal congestion.  She has had associated sinus pain and pressure.  She describes the mucosal discharge as bloody.  She has had episodes of epistaxis.  Over the past several days, she has developed soreness in her throat.  She has also developed a left lateral chest wall pain.  She denies any abdominal pain.  She has not had any nausea or vomiting.  She believes that her fevers began yesterday.  She states that prior to onset of the symptoms 2 weeks ago, she was in her normal state of health.  She denies any chronic medical conditions. ?  ? ?Home Medications ?Prior to Admission medications   ?Medication Sig Start Date End Date Taking? Authorizing Provider  ?acetaminophen (TYLENOL) 325 MG tablet Take 2 tablets (650 mg total) by mouth every 6 (six) hours as needed for up to 7 days for mild pain, moderate pain or fever. 11/06/21 11/13/21 Yes Gloris Manchester, MD  ?amoxicillin-clavulanate (AUGMENTIN) 875-125 MG tablet Take 1 tablet by mouth every 12 (twelve) hours for 7 days. 11/06/21 11/13/21 Yes Gloris Manchester, MD  ?fluticasone (FLONASE) 50 MCG/ACT nasal spray Place 1 spray into both nostrils daily. 11/01/21  Yes [provider]  ?ibuprofen (ADVIL) 600 MG tablet Take 1 tablet (600 mg total) by mouth every 6 (six) hours as needed. 11/06/21  Yes Gloris Manchester, MD  ?levocetirizine (XYZAL) 5 MG tablet Take 5 mg by mouth daily. 11/01/21  Yes [provider]  ?   ? ?Allergies    ?Patient has no known allergies.   ? ?Review of Systems   ?Review of Systems  ?Constitutional:  Positive for fatigue and fever.  ?HENT:  Positive for congestion, ear pain, nosebleeds, sinus pressure, sinus pain and sore throat.    ?Cardiovascular:  Positive for chest pain.  ?All other systems reviewed and are negative. ? ?Physical Exam ?Updated Vital Signs ?BP 119/72   Pulse 80   Temp 98.3 ?F (36.8 ?C) (Oral)   Resp 18   Ht 5\' 4"  (1.626 m)   Wt 50.3 kg   SpO2 98%   BMI 19.05 kg/m?  ?Physical Exam ?Vitals and nursing note reviewed.  ?Constitutional:   ?   General: She is not in acute distress. ?   Appearance: She is well-developed and normal weight. She is not ill-appearing, toxic-appearing or diaphoretic.  ?HENT:  ?   Head: Normocephalic and atraumatic.  ?   Right Ear: Tympanic membrane, ear canal and external ear normal.  ?   Left Ear: Tympanic membrane, ear canal and external ear normal.  ?   Nose: Congestion present.  ?   Right Sinus: Maxillary sinus tenderness and frontal sinus tenderness present.  ?   Left Sinus: Maxillary sinus tenderness and frontal sinus tenderness present.  ?   Mouth/Throat:  ?   Mouth: Mucous membranes are moist. No oral lesions.  ?   Pharynx: Oropharynx is clear. No pharyngeal swelling, oropharyngeal exudate, posterior oropharyngeal erythema or uvula swelling.  ?   Tonsils: No tonsillar exudate or tonsillar abscesses.  ?Eyes:  ?   Extraocular Movements: Extraocular movements intact.  ?   Conjunctiva/sclera: Conjunctivae normal.  ?Cardiovascular:  ?  Rate and Rhythm: Normal rate and regular rhythm.  ?   Heart sounds: No murmur heard. ?Pulmonary:  ?   Effort: Pulmonary effort is normal. No respiratory distress.  ?   Breath sounds: Normal breath sounds.  ?Abdominal:  ?   Palpations: Abdomen is soft.  ?   Tenderness: There is no abdominal tenderness.  ?Musculoskeletal:     ?   General: No swelling.  ?   Cervical back: Normal range of motion and neck supple.  ?   Right lower leg: No edema.  ?   Left lower leg: No edema.  ?Skin: ?   General: Skin is warm and dry.  ?   Capillary Refill: Capillary refill takes less than 2 seconds.  ?Neurological:  ?   General: No focal deficit present.  ?   Mental Status: She is  alert and oriented to person, place, and time.  ?Psychiatric:     ?   Mood and Affect: Mood normal.     ?   Behavior: Behavior normal.  ? ? ?ED Results / Procedures / Treatments   ?Labs ?(all labs ordered are listed, but only abnormal results are displayed) ?Labs Reviewed  ?COMPREHENSIVE METABOLIC PANEL - Abnormal; Notable for the following components:  ?    Result Value  ? Glucose, Bld 194 (*)   ? Calcium 8.6 (*)   ? Albumin 3.4 (*)   ? All other components within normal limits  ?CBC WITH DIFFERENTIAL/PLATELET - Abnormal; Notable for the following components:  ? WBC 17.2 (*)   ? RBC 3.72 (*)   ? Hemoglobin 11.4 (*)   ? HCT 35.2 (*)   ? Neutro Abs 14.6 (*)   ? Abs Immature Granulocytes 0.08 (*)   ? All other components within normal limits  ?URINALYSIS, ROUTINE W REFLEX MICROSCOPIC - Abnormal; Notable for the following components:  ? Color, Urine STRAW (*)   ? Specific Gravity, Urine 1.003 (*)   ? Hgb urine dipstick MODERATE (*)   ? All other components within normal limits  ?RESP PANEL BY RT-PCR (FLU A&B, COVID) ARPGX2  ?LIPASE, BLOOD  ?I-STAT BETA HCG BLOOD, ED ([facility], [facility], [facility] ONLY)  ?TROPONIN I (HIGH SENSITIVITY)  ?TROPONIN I (HIGH SENSITIVITY)  ? ? ?EKG ?EKG Interpretation ? ?Date/Time:  Sunday November 05 2021 17:38:58 EDT ?Ventricular Rate:  99 ?PR Interval:  176 ?QRS Duration: 74 ?QT Interval:  318 ?QTC Calculation: 408 ?R Axis:   59 ?Text Interpretation: Normal sinus rhythm Normal ECG No previous ECGs available Confirmed by Gloris Manchester [phone]) on 11/06/2021 8:30:14 AM ? ?Radiology ?DG Chest 2 View ? ?Result Date: 11/05/2021 ?CLINICAL DATA:  Chest pain EXAM: CHEST - 2 VIEW COMPARISON:  Chest x-ray 06/30/2012 FINDINGS: Heart size and mediastinal contours are within normal limits. No suspicious pulmonary opacities identified. No pleural effusion or pneumothorax visualized. No acute osseous abnormality appreciated. IMPRESSION: No acute intrathoracic process identified. Electronically Signed   By: Jannifer Hick M.D.    On: 11/05/2021 19:36  ? ?CT Soft Tissue Neck W Contrast ? ?Result Date: 11/06/2021 ?CLINICAL DATA:  Provided history: Parotid region mass. Additional history provided: Intermittent chest pain, abdominal pain, epistaxis 4 several weeks (but worse over the past week). EXAM: CT NECK WITH CONTRAST TECHNIQUE: Multidetector CT imaging of the neck was performed using the standard protocol following the bolus administration of intravenous contrast. RADIATION DOSE REDUCTION: This exam was performed according to the departmental dose-optimization program which includes automated exposure control, adjustment of the mA and/or kV according  to patient size and/or use of iterative reconstruction technique. CONTRAST:  OMNIPAQUE IOHEXOL 350 MG/ML SOLN COMPARISON:  None. FINDINGS: Pharynx and larynx: Symmetric prominence of the palatine tonsils. Multiple punctate calcific foci within the right palatine tonsil, which may reflect postinflammatory calcifications or tonsilloliths. Salivary glands: Ovoid nodule within the superficial lobe of the left parotid gland, measuring 1.4 x 0.9 cm. This may reflect a primary parotid neoplasm or nonspecific enlarged intraparotid lymph node (series 3, image 47) (series 4, image 75). Thyroid: Unremarkable. Lymph nodes: Enlarged right retropharyngeal lymph node, measuring 10-11 mm in short axis (series 4, image 29) (series 3, image 56). Additionally, lymph nodes within the bilateral upper neck are somewhat prominent in size, although not technically enlarged by short axis criteria (measuring up to 9 mm in short axis). Vascular: The major vascular structures of the neck are patent. Limited intracranial: No evidence of acute intracranial abnormality within the field of view. Visualized orbits: No orbital mass or acute orbital finding. Mastoids and visualized paranasal sinuses: Mild mucosal thickening within the bilateral ethmoid air cells. Small mucous retention cysts, and background mild mucosal  thickening, within the right sphenoid sinus. Mild mucosal thickening within the bilateral maxillary sinuses. No significant mastoid effusion. Skeleton: Cervical spondylosis with multilevel disc space narrowing, dis

## 2021-11-06 NOTE — Discharge Instructions (Signed)
There is a prescription for an antibiotic that was sent to your pharmacy.  This medication is called Augmentin.  Take twice a day for the next 7 days for treatment of your infection. ? ?If you experience pain or fevers, take Tylenol and/or ibuprofen as needed.  These medications are available over-the-counter.  Prescriptions for these are provided as well to assist in obtaining them. ? ?There is a number below to call to set up a follow-up appointment with the ear nose and throat doctor.  Call this number to set up an appointment for sometime next week or early next week to ensure that your symptoms are resolving.  There was also a finding on your CT scan and your parotid gland which will require follow-up with the ear, nose, and throat doctor.  Discussed this at follow-up appointment. ? ?If you experience worsening symptoms despite antibiotics, please return to the emergency department. ?

## 2021-11-06 NOTE — ED Notes (Signed)
Walked patient to the bathroom patient did well patient is now back in bed on the monitor with family at bedside 

## 2021-12-04 DIAGNOSIS — R040 Epistaxis: Secondary | ICD-10-CM | POA: Diagnosis not present

## 2021-12-04 DIAGNOSIS — M47812 Spondylosis without myelopathy or radiculopathy, cervical region: Secondary | ICD-10-CM | POA: Diagnosis not present

## 2021-12-04 DIAGNOSIS — K118 Other diseases of salivary glands: Secondary | ICD-10-CM | POA: Diagnosis not present

## 2021-12-04 DIAGNOSIS — Z09 Encounter for follow-up examination after completed treatment for conditions other than malignant neoplasm: Secondary | ICD-10-CM | POA: Diagnosis not present

## 2021-12-04 DIAGNOSIS — J329 Chronic sinusitis, unspecified: Secondary | ICD-10-CM | POA: Diagnosis not present

## 2022-05-19 ENCOUNTER — Emergency Department (HOSPITAL_COMMUNITY)
Admission: EM | Admit: 2022-05-19 | Discharge: 2022-05-20 | Discharge status: Against Medical Advice | Payer: Medicaid Other | Attending: Emergency Medicine | Admitting: Emergency Medicine

## 2022-05-19 ENCOUNTER — Encounter (HOSPITAL_COMMUNITY): Payer: Self-pay | Admitting: Emergency Medicine

## 2022-05-19 ENCOUNTER — Emergency Department (HOSPITAL_COMMUNITY): Payer: Medicaid Other

## 2022-05-19 DIAGNOSIS — R0602 Shortness of breath: Secondary | ICD-10-CM | POA: Diagnosis not present

## 2022-05-19 DIAGNOSIS — Z5321 Procedure and treatment not carried out due to patient leaving prior to being seen by health care provider: Secondary | ICD-10-CM | POA: Insufficient documentation

## 2022-05-19 DIAGNOSIS — M25512 Pain in left shoulder: Secondary | ICD-10-CM | POA: Insufficient documentation

## 2022-05-19 DIAGNOSIS — R079 Chest pain, unspecified: Secondary | ICD-10-CM | POA: Diagnosis not present

## 2022-05-19 LAB — CBC
HCT: 38.5 % (ref 36.0–46.0)
Hemoglobin: 12.5 g/dL (ref 12.0–15.0)
MCH: 30.8 pg (ref 26.0–34.0)
MCHC: 32.5 g/dL (ref 30.0–36.0)
MCV: 94.8 fL (ref 80.0–100.0)
Platelets: 352 10*3/uL (ref 150–400)
RBC: 4.06 MIL/uL (ref 3.87–5.11)
RDW: 12.5 % (ref 11.5–15.5)
WBC: 8 10*3/uL (ref 4.0–10.5)
nRBC: 0 % (ref 0.0–0.2)

## 2022-05-19 LAB — BASIC METABOLIC PANEL
Anion gap: 9 (ref 5–15)
BUN: 8 mg/dL (ref 6–20)
CO2: 22 mmol/L (ref 22–32)
Calcium: 8.4 mg/dL — ABNORMAL LOW (ref 8.9–10.3)
Chloride: 105 mmol/L (ref 98–111)
Creatinine, Ser: 0.64 mg/dL (ref 0.44–1.00)
GFR, Estimated: >60 mL/min (ref >60)
Glucose, Bld: 192 mg/dL — ABNORMAL HIGH (ref 70–99)
Potassium: 3.8 mmol/L (ref 3.5–5.1)
Sodium: 136 mmol/L (ref 135–145)

## 2022-05-19 LAB — TROPONIN I (HIGH SENSITIVITY)
Troponin I (High Sensitivity): 3 ng/L (ref <18)
Troponin I (High Sensitivity): <2 ng/L (ref <18)

## 2022-05-19 NOTE — ED Triage Notes (Signed)
Patient w/ left sided neck pain that shoots to down to the left arm starting Friday. No precipitating factors noted.

## 2022-05-19 NOTE — ED Provider Triage Note (Signed)
Emergency Medicine Provider Triage Evaluation Note  Brandy Suarez , a 51 y.o. female  was evaluated in triage.  Pt complains of pain in left arm shoulder area onset Friday, worse last night with CP and SHOB. Pain is constant, no history of similar pain previously.  Brought in by family Non smoker. No hx htn, dm, hyperlipidemia.  Review of Systems  Positive: As above Negative: As above  Physical Exam  BP 133/72   Pulse 77   Temp 98.1 F (36.7 C) (Oral)   SpO2 100%  Gen:   Awake, no distress   Resp:  Normal effort  MSK:   Moves extremities without difficulty  Other:    Medical Decision Making  Medically screening exam initiated at 11:54 AM.  Appropriate orders placed.  Brandy Suarez was informed that the remainder of the evaluation will be completed by another provider, this initial triage assessment does not replace that evaluation, and the importance of remaining in the ED until their evaluation is complete.     Tacy Learn, PA-C 05/19/22 1156

## 2022-05-20 NOTE — ED Notes (Signed)
Checked the whole lobby for pt. Pt not located.

## 2022-05-20 NOTE — ED Notes (Signed)
Pt name called 3x for vitals, no response

## 2023-01-04 IMAGING — CT CT NECK W/ CM
4 of 5 series · 14 of 33 positions shown, 16 images · IV contrast (agent unspecified)
Comparison: None.

CLINICAL DATA: Provided history: Parotid region mass. Additional
history provided: Intermittent chest pain, abdominal pain, epistaxis
4 several weeks (but worse over the past week).

EXAM:
CT NECK WITH CONTRAST
TECHNIQUE: Multidetector CT imaging of the neck was performed using the
standard protocol following the bolus administration of intravenous
contrast.

[Series 3: neck 2.0 st · axial · 0.41mm/px · z∈[+1190,+1330]mm · 3 of 141 slices shown, 4 images]
[im 36/141  soft-tissue]
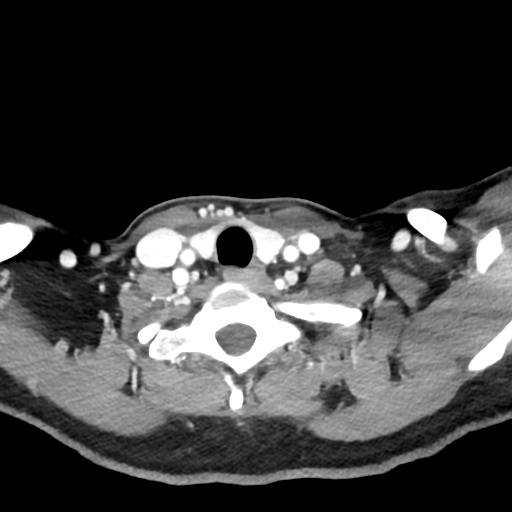
[im 36/141  bone]
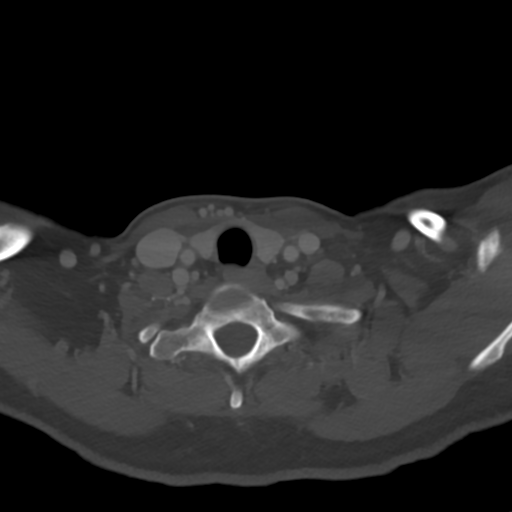
[im 71/141  bone]
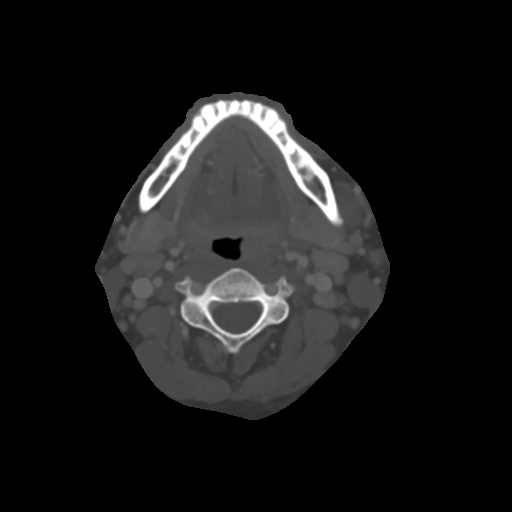
[im 106/141  bone]
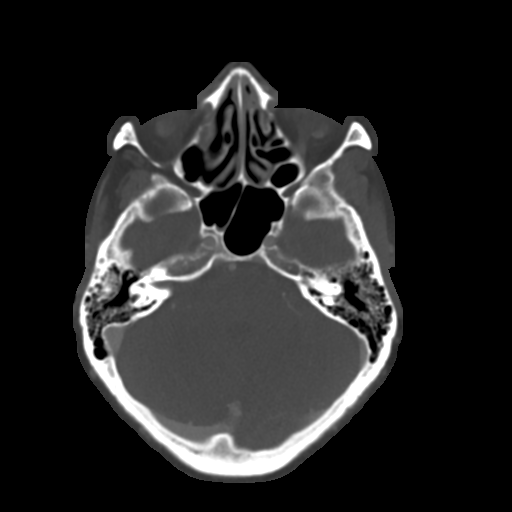

[Series 4: sagittal · sagittal · 0.48mm/px · 5 of 78 slices shown, 6 images]
[im 26/78  bone]
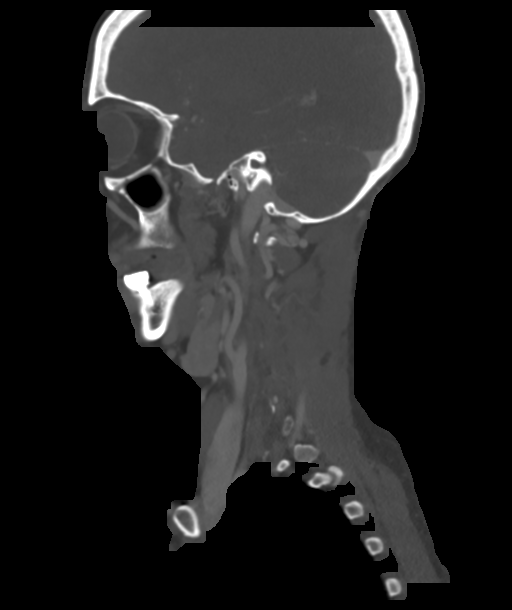
[im 33/78  bone]
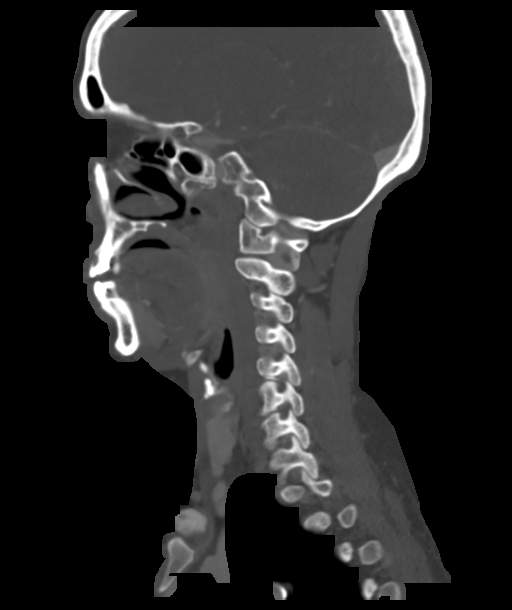
[im 39/78  soft-tissue]
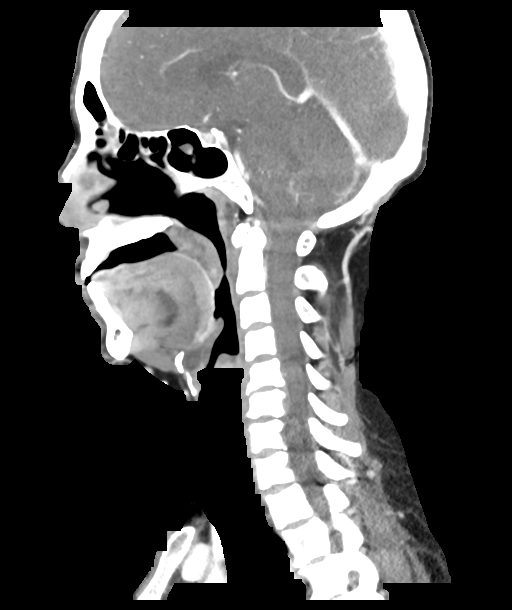
[im 39/78  bone]
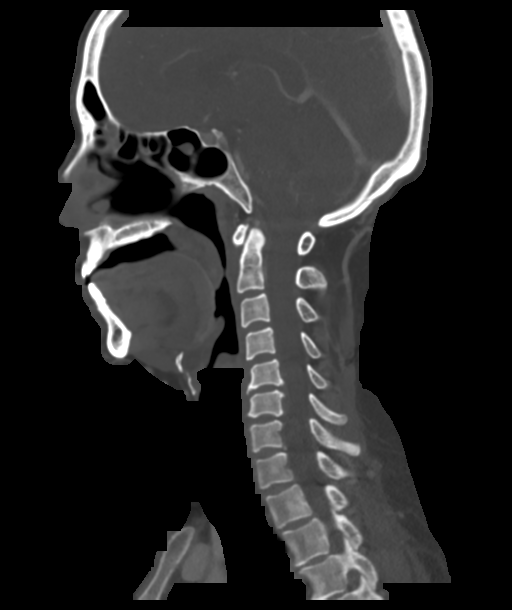
[im 45/78  bone]
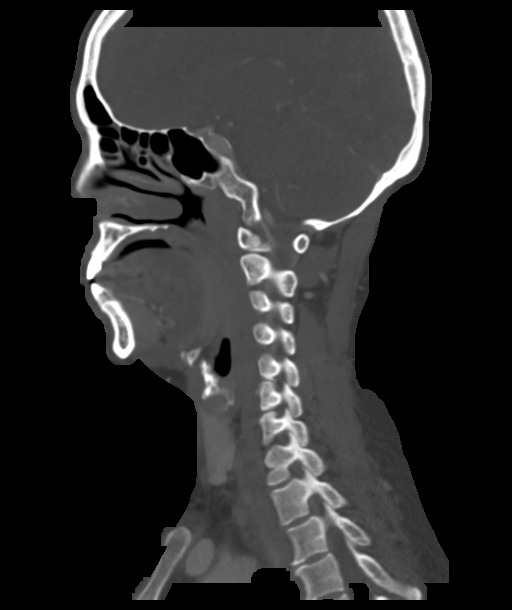
[im 52/78  bone]
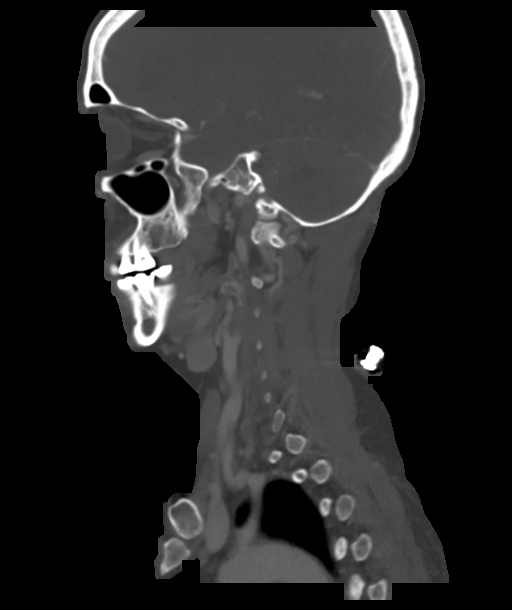

[Series 5: coronal · coronal · 0.55mm/px · 3 of 91 slices shown]
[im 19/91  bone]
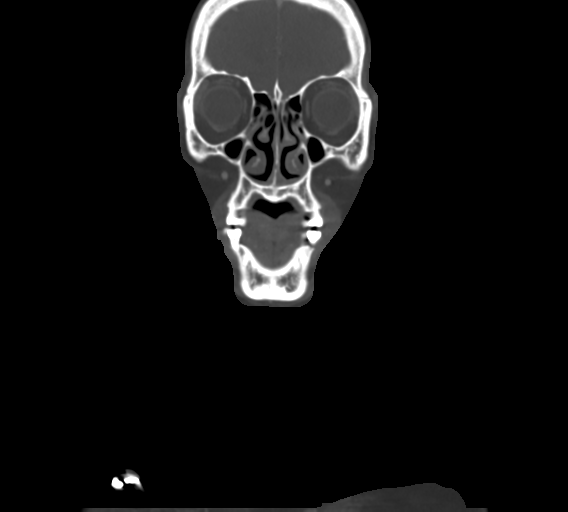
[im 37/91  bone]
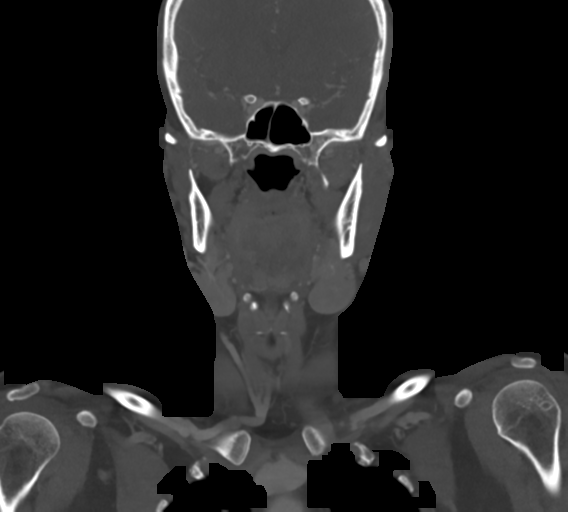
[im 55/91  bone]
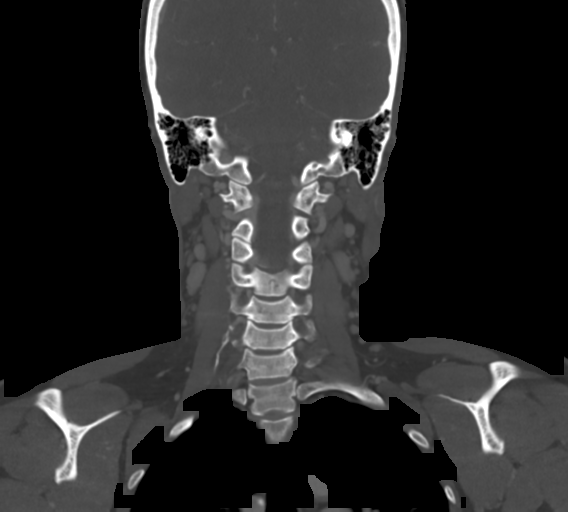

[Series 6: orthogonal · axial · 0.39mm/px · z∈[+1182,+1320]mm · 3 of 139 slices shown]
[im 35/139  bone]
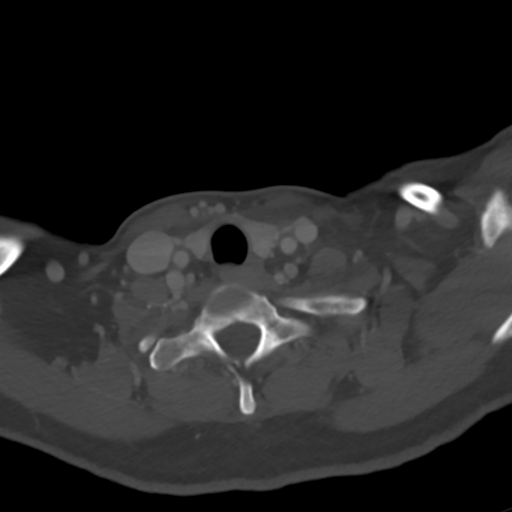
[im 70/139  bone]
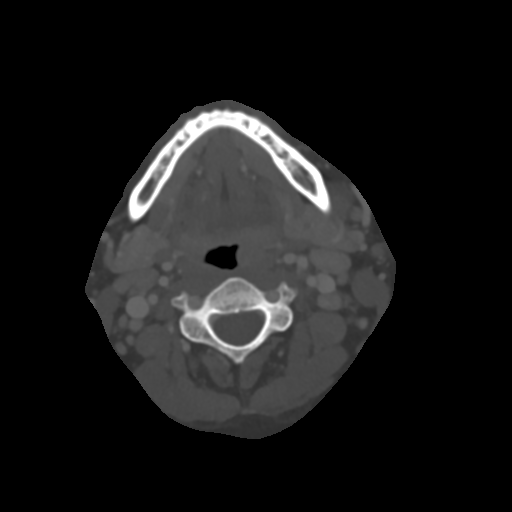
[im 104/139  bone]
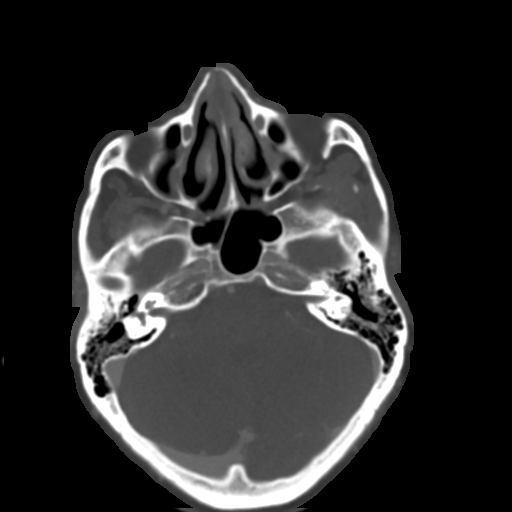

[14 of 33 positions shown; findings below may reference images not displayed]

RADIATION DOSE REDUCTION: This exam was performed according to the
departmental dose-optimization program which includes automated
exposure control, adjustment of the mA and/or kV according to
patient size and/or use of iterative reconstruction technique.

CONTRAST:  100mL OMNIPAQUE IOHEXOL 350 MG/ML SOLN
FINDINGS: Pharynx and larynx: Symmetric prominence of the palatine tonsils.
Multiple punctate calcific foci within the right palatine tonsil,
which may reflect postinflammatory calcifications or tonsilloliths.

Salivary glands: Ovoid nodule within the superficial lobe of the
left parotid gland, measuring 1.4 x 0.9 cm. This may reflect a
primary parotid neoplasm or nonspecific enlarged intraparotid lymph
node (series 3, image 47) (series 4, image 75).

Thyroid: Unremarkable.

Lymph nodes: Enlarged right retropharyngeal lymph node, measuring
10-11 mm in short axis (series 4, image 29) (series 3, image 56).
Additionally, lymph nodes within the bilateral upper neck are
somewhat prominent in size, although not technically enlarged by
short axis criteria (measuring up to 9 mm in short axis).

Vascular: The major vascular structures of the neck are patent.

Limited intracranial: No evidence of acute intracranial abnormality
within the field of view.

Visualized orbits: No orbital mass or acute orbital finding.

Mastoids and visualized paranasal sinuses: Mild mucosal thickening
within the bilateral ethmoid air cells. Small mucous retention
cysts, and background mild mucosal thickening, within the right
sphenoid sinus. Mild mucosal thickening within the bilateral
maxillary sinuses. No significant mastoid effusion.

Skeleton: Cervical spondylosis with multilevel disc space narrowing,
disc bulges, endplate spurring and uncovertebral hypertrophy. Disc
space narrowing is greatest at C5-C6 (moderate at this level). No
appreciable high-grade spinal canal stenosis. Bilateral bony neural
foraminal narrowing at C5-C6.

Upper chest: Separately reported on concurrently performed chest CT.
No airspace consolidation within the imaged lung apices.
IMPRESSION: 1.4 x 0.9 cm ovoid nodule in the superficial lobe of the left
parotid gland. This may reflect a primary parotid neoplasm or
nonspecific enlarged intraparotid lymph node. ENT consultation
should be considered.

Symmetric prominence of the palatine tonsils, nonspecific but
compatible with acute tonsillitis/pharyngitis in the appropriate
clinical setting.

Enlarged right retropharyngeal lymph node (measuring 10-11 mm in
short axis). Additionally, lymph nodes within the bilateral upper
neck are prominent in number, although not technically enlarged by
short axis criteria. These lymph nodes may be reactive in etiology.
However, close clinical follow-up is recommended (with imaging
follow-up as warranted) to ensure resolution and to exclude
alternative etiologies.

Multiple punctate calcific foci within the right palatine tonsil,
which may reflect postinflammatory calcifications or tonsilloliths.

Mild inflammatory paranasal sinus disease.

Cervical spondylosis.

## 2023-01-04 IMAGING — CT CT ANGIO CHEST
2 of 6 series · 18 of 36 positions shown · IV contrast (agent unspecified)
Comparison: None.

CLINICAL DATA: Intermittent chest pain, abd pain, epistaxis for a
few weeks. Worse over the past week.

EXAM:
CT ANGIOGRAPHY CHEST WITH CONTRAST
TECHNIQUE: Multidetector CT imaging of the chest was performed using the
standard protocol during bolus administration of intravenous
contrast. Multiplanar CT image reconstructions and MIPs were
obtained to evaluate the vascular anatomy.

[Series 7: pe thins · axial · 0.60mm/px · z∈[+974,+1188]mm · 17 of 341 slices shown]
[im 18/341  lung]
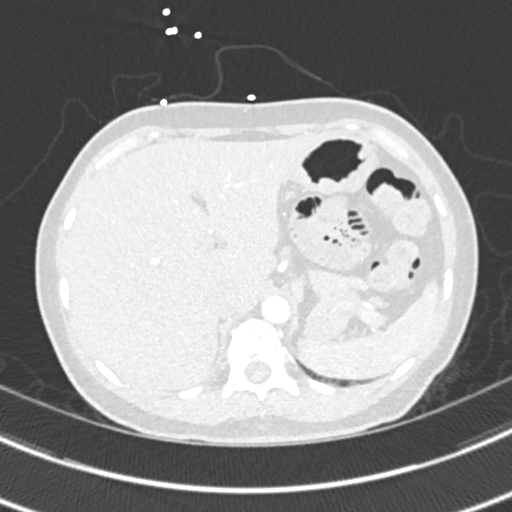
[im 35/341  mediastinal]
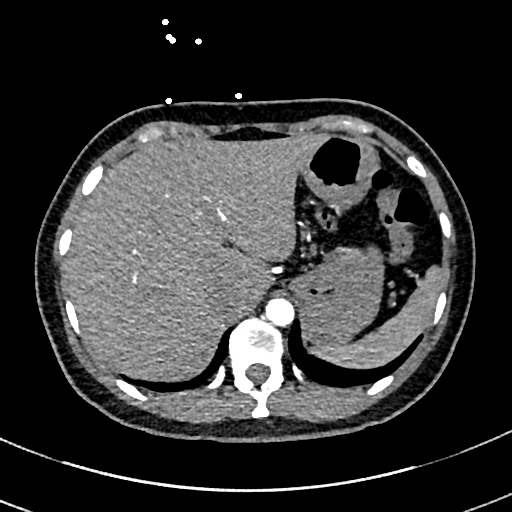
[im 52/341  lung]
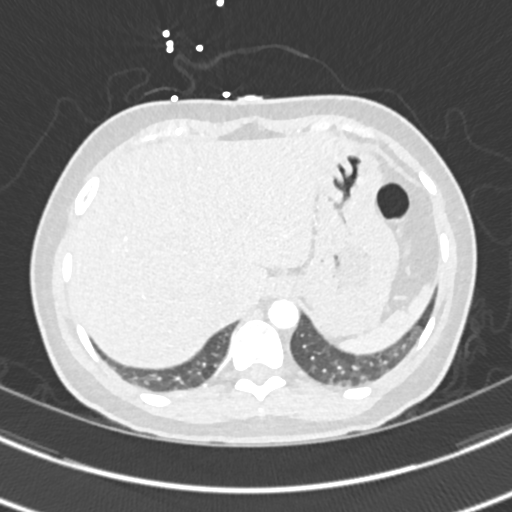
[im 69/341  mediastinal]
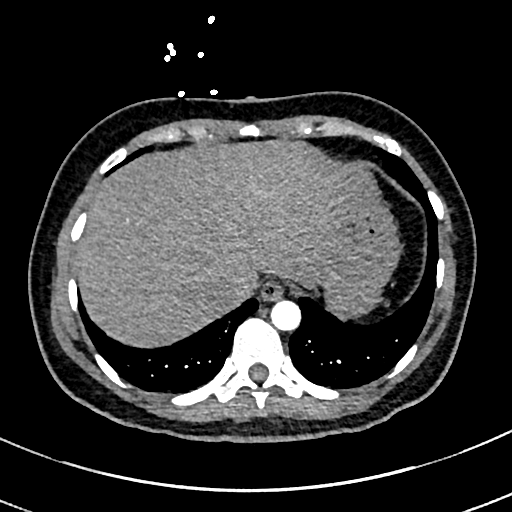
[im 103/341  lung]
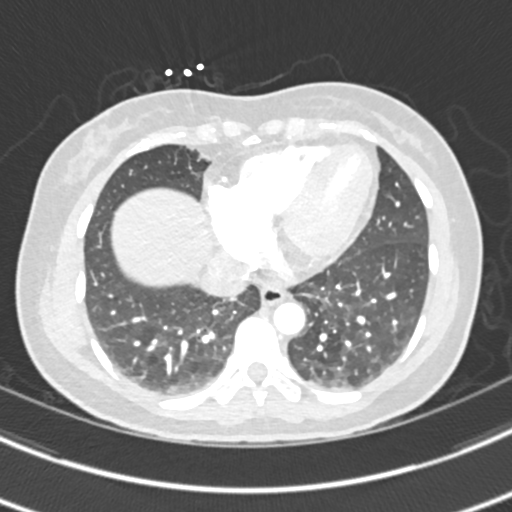
[im 120/341  mediastinal]
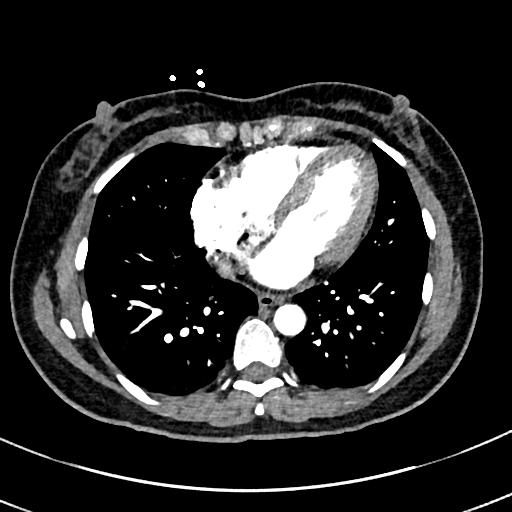
[im 137/341  lung]
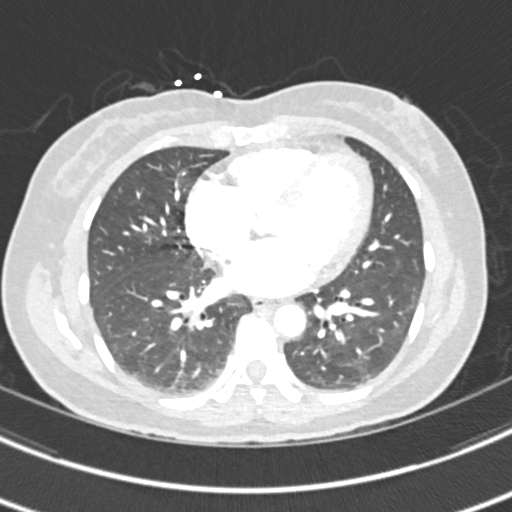
[im 154/341  mediastinal]
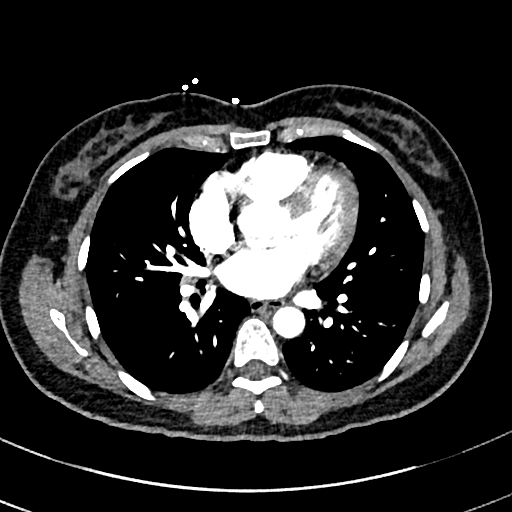
[im 171/341  lung]
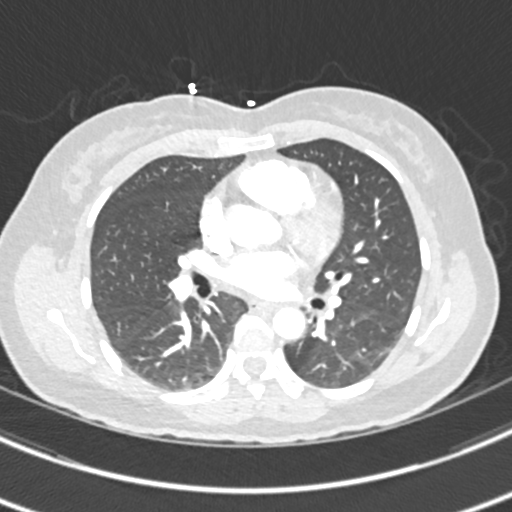
[im 188/341  mediastinal]
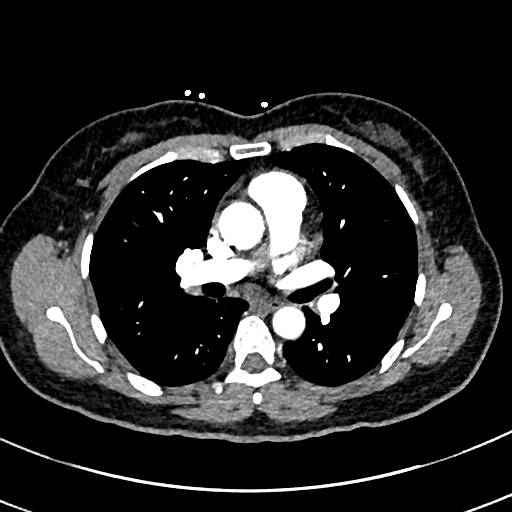
[im 205/341  lung]
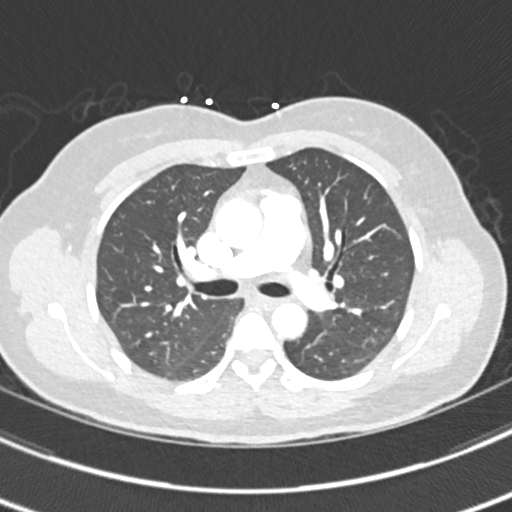
[im 222/341  mediastinal]
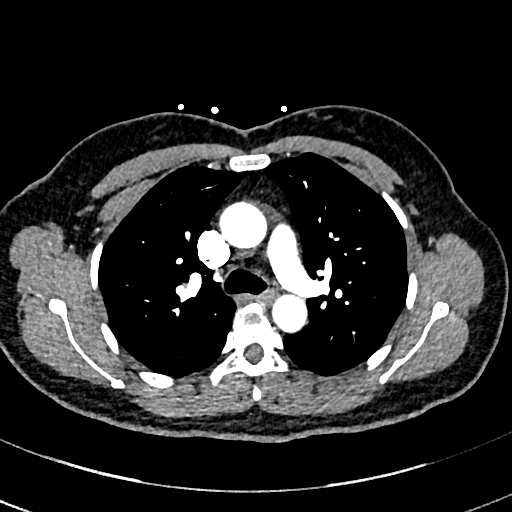
[im 239/341  lung]
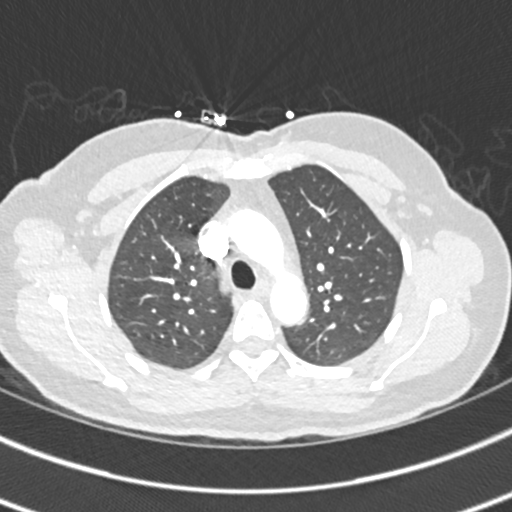
[im 273/341  mediastinal]
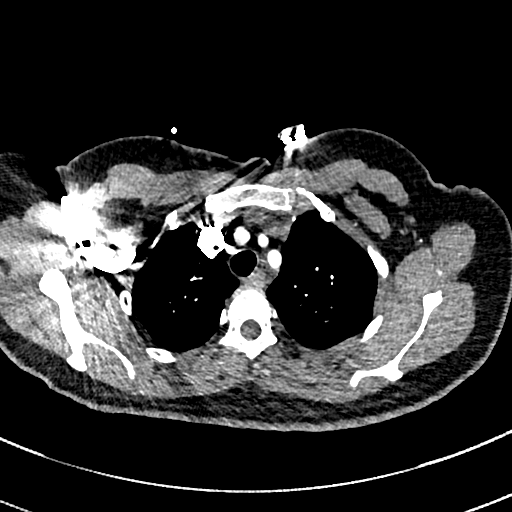
[im 290/341  lung]
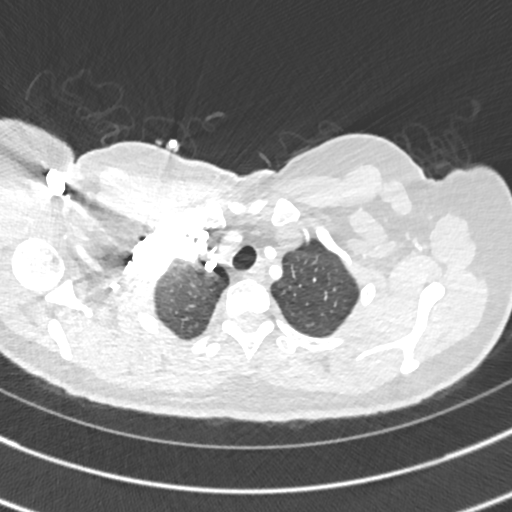
[im 307/341  mediastinal]
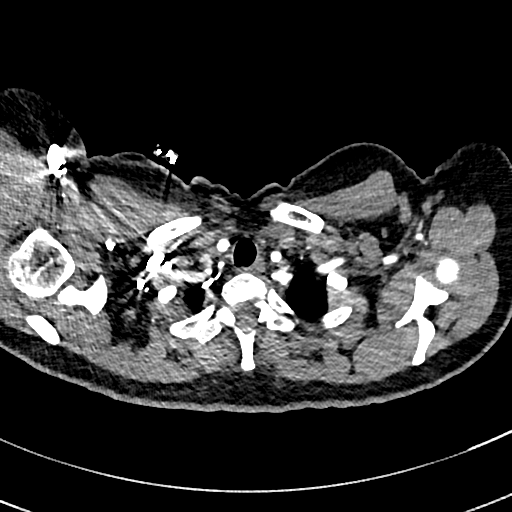
[im 324/341  lung]
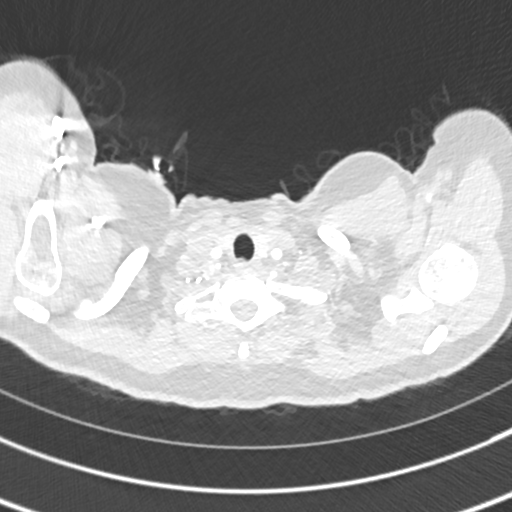

[Series 8: pe 2mm cor · coronal · 0.53mm/px · 1 of 110 slices shown]
[im 55/110  mediastinal]
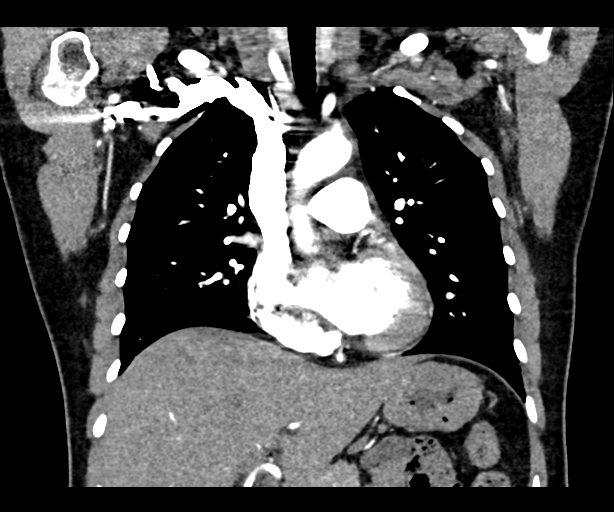

[18 of 36 positions shown; findings below may reference images not displayed]

RADIATION DOSE REDUCTION: This exam was performed according to the
departmental dose-optimization program which includes automated
exposure control, adjustment of the mA and/or kV according to
patient size and/or use of iterative reconstruction technique.

CONTRAST:  100mL OMNIPAQUE IOHEXOL 350 MG/ML SOLN
FINDINGS: Cardiovascular: Satisfactory opacification of the pulmonary arteries
to the segmental level. No evidence of pulmonary embolism. Aortic
atherosclerosis without aneurysmal dilation. Normal heart size. No
pericardial effusion.

Mediastinum/Nodes: No discrete thyroid nodule. No pathologically
enlarged mediastinal, hilar or axillary lymph nodes. The trachea and
esophagus are grossly unremarkable.

Lungs/Pleura: Mild diffuse bronchial wall thickening. 3 mm right
lower lobe pulmonary nodule on image 68/6. Mosaic attenuation in the
lung bases which may be related to technique or small airway
disease. No pleural effusion or pneumothorax.

Upper Abdomen: No acute abnormality.

Musculoskeletal: Bone island in the right humeral head. No acute
osseous abnormality.

Review of the MIP images confirms the above findings.
IMPRESSION: 1. No evidence of pulmonary embolism.
2. Mild diffuse bronchial wall thickening with mosaic attenuation in
the lung bases suggestive of bronchitis/small airway disease.
3. 3 mm right solid pulmonary nodule. No routine follow-up imaging
is recommended per [HOSPITAL] Guidelines. These guidelines
do not apply to immunocompromised patients and patients with cancer.
Follow up in patients with significant comorbidities as clinically
warranted. For lung cancer screening, adhere to Lung-RADS
guidelines. Reference: Radiology. 6676; 284(1):228-43.
4. Aortic Atherosclerosis (8JST1-NG8.8).

## 2023-09-20 ENCOUNTER — Ambulatory Visit: Payer: Medicaid Other | Admitting: Nurse Practitioner
